# Patient Record
Sex: Female | Born: 1960 | Race: White | Hispanic: No | Smoking: Current every day smoker
Health system: Southern US, Community
[De-identification: ages and names within clinical notes are randomized; demographics above are authoritative.]

## PROBLEM LIST (undated history)

## (undated) DIAGNOSIS — J208 Acute bronchitis due to other specified organisms: Secondary | ICD-10-CM

## (undated) DIAGNOSIS — I10 Essential (primary) hypertension: Secondary | ICD-10-CM

## (undated) DIAGNOSIS — B9689 Other specified bacterial agents as the cause of diseases classified elsewhere: Secondary | ICD-10-CM

## (undated) HISTORY — PX: OTHER SURGICAL HISTORY: SHX169

## (undated) HISTORY — DX: Other specified bacterial agents as the cause of diseases classified elsewhere: J20.8

## (undated) HISTORY — PX: TUBAL LIGATION: SHX77

## (undated) HISTORY — DX: Other specified bacterial agents as the cause of diseases classified elsewhere: B96.89

---

## 2001-09-14 ENCOUNTER — Inpatient Hospital Stay (HOSPITAL_COMMUNITY): Admission: EM | Admit: 2001-09-14 | Discharge: 2001-09-16 | Payer: Self-pay | Admitting: *Deleted

## 2001-09-14 ENCOUNTER — Encounter: Payer: Self-pay | Admitting: Emergency Medicine

## 2001-09-16 ENCOUNTER — Inpatient Hospital Stay (HOSPITAL_COMMUNITY): Admission: AD | Admit: 2001-09-16 | Discharge: 2001-09-21 | Payer: Self-pay | Admitting: Psychiatry

## 2008-06-18 ENCOUNTER — Ambulatory Visit: Payer: Self-pay | Admitting: Family Medicine

## 2008-08-06 ENCOUNTER — Ambulatory Visit: Payer: Self-pay | Admitting: Family Medicine

## 2008-08-19 ENCOUNTER — Ambulatory Visit: Payer: Self-pay | Admitting: Family Medicine

## 2011-04-02 NOTE — H&P (Signed)
[facility]  Patient:    Alisha Franco, Alisha Franco Visit Number: [id number] MRN: [id number]          Service Type: PSY Location: 500 0506 01 Attending Physician:  Denny Peon Dictated by:   Jeanice Lim, M.D. Admit Date:  09/16/2001 Discharge Date: 09/21/2001                     Psychiatric Admission Assessment  IDENTIFYING DATA:  This is a 50 year old separated Caucasian female with a history of depression and a first psychiatric admission that was four years ago.  The patient has been separated since October 11th.  Told by her husband that he is moving out in August after being married for 14 years.  Describing being depressed a year and a half.  Last year, father died.  Upset because of the conflict between her and her husband.  Feels that she lost her identity. Described depressive symptoms including decreased sleep, low energy, crying spells, helplessness and had a positive history of a suicide attempt four years ago, overdosing, resulting in ICU admission.  She admitted to drinking and taking half a bottle of pills as a suicide attempt, expressing some regret at the time of admission.  PAST PSYCHIATRIC HISTORY:  Positive history of suicidality.  Followed by Dr. Donell Beers.  Diagnosed with bipolar five years ago.  Treated with Paxil, Prozac, Zoloft, trazodone in the past, all resulting in side effects.  SOCIAL HISTORY:  Born and raised in [place].  Four siblings.  Completed ninth grade.  Has three children and lives with these children.  Husband moved out October 11th after being married for 14 years.  Last worked four years ago.  FAMILY HISTORY:  Negative for depression or bipolar disorder.  MEDICAL HISTORY:  Primary care physician Dr. Lorina Rabon.  History of hypertension.  MEDICATIONS:  Norvasc and Celexa and Ativan p.r.n.  ALLERGIES:  No known drug allergies.  PHYSICAL EXAMINATION:  Unremarkable.  Neurologically nonfocal.  MENTAL STATUS  EXAMINATION:  Tearful.  Slow.  Agitated.  Depressed.  Affect labile.  Thought process goal directed.  Thought content negative for psychotic symptoms.  Positive passive suicidal ideation.  Cognitively intact.  ADMISSION DIAGNOSES: Axis I:    Major depression, severe, recurrent without psychotic features. Axis II:   None. Axis III:  1. Hypertension.            2. Status post overdose. Axis IV:   Moderate (problems with primary support group). Axis V:    30/65.  TREATMENT PLAN:  The patient was to be admitted, resumed on medications. Medications are optimized for treatment of depression while monitoring safety, developing a cohesive aftercare plan, a couples therapy session was requested and she is to participate in individual, group and milieu therapy.  To be discharged once considered to have no risk issues after benefiting from crisis intervention.  ESTIMATED LENGTH OF STAY:  Five days. Dictated by:   Jeanice Lim, M.D. Attending Physician:  Denny Peon DD:  11/01/01 TD:  11/02/01 Job: [id number]

## 2011-04-02 NOTE — Discharge Summary (Signed)
[facility]  Patient:    Alisha Franco, Alisha Franco Visit Number: [id number] MRN: [id number]          Service Type: PSY Location: 500 0506 01 Attending Physician:  Denny Peon Dictated by:   Jeanice Lim, M.D. Admit Date:  09/16/2001 Discharge Date: 09/21/2001                             Discharge Summary  IDENTIFYING DATA:  This is a 50 year old separated Caucasian female with a history of depression, separated since October after being married for 14 years.  Reporting increased depression, weight loss, and suicidal thoughts with a positive history of suicide attempts.  ADMISSION MEDICATIONS: 1. Norvasc. 2. Celexa. 3. Ativan. 4. History of also Xanax.  ALLERGIES:   No known drug allergies.  PHYSICAL EXAMINATION:  GENERAL:  Unremarkable.  NEUROLOGIC:  Nonfocal.  ROUTINE ADMISSION LABORATORY DATA:  Essentially within normal limits including thyroid profile.  MENTAL STATUS EXAMINATION:  The patient was tearful, psychomotor retarded. Mood: Depressed.  Affect: Labile.  Thought process: Goal directed.  Thought content: Negative for psychotic symptoms.  Passive suicidal ideation was present.  Cognitive: Intact.  ADMITTING DIAGNOSES: Axis I:    Major depression, severe, recurrent without psychotic features. Axis II:   None. Axis III:  History of hypertension. Axis IV:   Mild problems with primary support. Axis V:    30/65.  HOSPITAL COURSE:  The patient was admitted and started on routine p.r.n. medications including Norvasc, Celexa, and Wellbutrin.  The patient was given Librium q.6h. p.r.n. withdrawal and then Klonopin was started low dose to minimize abuse risk and optimize control of anxiety.  Seroquel was scheduled q.h.s.  As medications were optimized, the patient tolerated these without side effects.  CONDITION AT DISCHARGE:  Markedly improved.  She reported feeling less depressed, no longer suicidal.  Mood was mostly euthymic.   Affect: Bright. Thought process: Goal directed.  Thought content: Negative for dangerous ideations or psychotic symptoms.  Cognitive: Intact.  Judgment and insight were good.  Reporting motivation to be compliant with the follow-up plan.  DISPOSITION:  The patient was discharged to follow up with [facility], Dr. Donell Beers and therapist on November 27 at 9:30 a.m.  DISCHARGE MEDICATIONS: 1. Norvasc 10 mg q.a.m. 2. Klonopin 0.5 mg one half q.a.m. and one q.h.s. 3. Celexa 40 mg q.a.m. 4. Wellbutrin SR 150 mg b.i.d. 5. Seroquel 100 mg q.h.s. 6. Ambien 10 mg q.h.s. p.r.n. insomnia.  DISCHARGE DIAGNOSES: Axis I:    Major depression, severe, recurrent without psychotic features. Axis II:   None. Axis III:  History of hypertension. Axis IV:   Mild problems with primary support. Axis V:    Global assessment of functioning on discharge was 55-60. Dictated by:   Jeanice Lim, M.D. Attending Physician:  Denny Peon DD:  11/01/01 TD:  11/02/01 Job: [id number]

## 2014-08-12 ENCOUNTER — Encounter (HOSPITAL_COMMUNITY): Payer: Self-pay | Admitting: Emergency Medicine

## 2014-08-12 ENCOUNTER — Emergency Department (INDEPENDENT_AMBULATORY_CARE_PROVIDER_SITE_OTHER)
Admission: EM | Admit: 2014-08-12 | Discharge: 2014-08-12 | Disposition: A | Discharge status: Home / Self Care | Payer: BC Managed Care – PPO | Source: Home / Self Care | Attending: Emergency Medicine | Admitting: Emergency Medicine

## 2014-08-12 ENCOUNTER — Emergency Department (INDEPENDENT_AMBULATORY_CARE_PROVIDER_SITE_OTHER): Payer: BC Managed Care – PPO

## 2014-08-12 DIAGNOSIS — R030 Elevated blood-pressure reading, without diagnosis of hypertension: Secondary | ICD-10-CM

## 2014-08-12 DIAGNOSIS — S63509A Unspecified sprain of unspecified wrist, initial encounter: Secondary | ICD-10-CM

## 2014-08-12 DIAGNOSIS — IMO0001 Reserved for inherently not codable concepts without codable children: Secondary | ICD-10-CM

## 2014-08-12 DIAGNOSIS — W108XXA Fall (on) (from) other stairs and steps, initial encounter: Secondary | ICD-10-CM

## 2014-08-12 DIAGNOSIS — S63502A Unspecified sprain of left wrist, initial encounter: Secondary | ICD-10-CM

## 2014-08-12 HISTORY — DX: Essential (primary) hypertension: I10

## 2014-08-12 MED ORDER — IBUPROFEN 800 MG PO TABS
800.0000 mg | ORAL_TABLET | Freq: Once | ORAL | Status: AC
  Administered 2014-08-12: 800 mg via ORAL

## 2014-08-12 MED ORDER — IBUPROFEN 800 MG PO TABS
ORAL_TABLET | ORAL | Status: AC
  Filled 2014-08-12: qty 1

## 2014-08-12 NOTE — ED Notes (Signed)
Pt  Fell  yest   On  Wet   Steps      inj  l  Wrist         Pt  Has      Pain     And  Some  Swelling  To  The  l  Wrist

## 2014-08-12 NOTE — ED Provider Notes (Signed)
CSN: [id number]     Arrival date & time 08/12/14  1145 History   First MD Initiated Contact with Patient 08/12/14 1321     Chief Complaint  Patient presents with  . Fall   (Consider location/radiation/quality/duration/timing/severity/associated sxs/prior Treatment) HPI Comments: Patient states she slipped on some outdoor stairs yesterday and fell injuring her left wrist. States she also fell on to left buttock and it is still a bit sore.  Denies previous injury or surgery. No head injury/LOC.  Of note, when patient was notiifed of her elevated blood pressure, patient stated she self discontinued her blood pressure medication because it was causing her feet to swell.    Patient is a 53 y.o. female presenting with fall. The history is provided by the patient.  Fall This is a new problem. The current episode started yesterday.    Past Medical History  Diagnosis Date  . Hypertension    Past Surgical History  Procedure Laterality Date  . Btl     History reviewed. No pertinent family history. History  Substance Use Topics  . Smoking status: Current Every Day Smoker  . Smokeless tobacco: Not on file  . Alcohol Use: Yes   OB History   Grav Para Term Preterm Abortions TAB SAB Ect Mult Living                 Review of Systems  All other systems reviewed and are negative.   Allergies  Review of patient's allergies indicates no known allergies.  Home Medications   Prior to Admission medications   Not on File   BP 187/101  Pulse 115  Temp(Src) 98 F (36.7 C) (Oral)  Resp 16  SpO2 96% Physical Exam  Nursing note and vitals reviewed. Constitutional: She is oriented to person, place, and time. She appears well-developed and well-nourished. No distress.  HENT:  Head: Normocephalic and atraumatic.  Eyes: Conjunctivae are normal. No scleral icterus.  Cardiovascular: Normal rate, regular rhythm and normal heart sounds.   Pulmonary/Chest: Effort normal and breath sounds  normal.  Musculoskeletal:       Left wrist: She exhibits decreased range of motion, tenderness and swelling. She exhibits no effusion, no crepitus, no deformity and no laceration.  Radial pulse intact. CSM exam of left hand intact. +pain with supination and moderate STS at left radial wrist.  Neurological: She is alert and oriented to person, place, and time.  Skin: Skin is warm and dry. No rash noted. No erythema.  +intact  Psychiatric: She has a normal mood and affect. Her behavior is normal.    ED Course  Procedures (including critical care time) Labs Review Labs Reviewed - No data to display  Imaging Review Dg Wrist Complete Left  08/12/2014   CLINICAL DATA:  Patient fell last night. Pain in the radial side of the wrist. Soft tissue swelling and redness. Limited range of motion because of pain.  EXAM: LEFT WRIST - COMPLETE 3+ VIEW  COMPARISON:  None.  FINDINGS: There is no evidence of fracture or dislocation. There is no evidence of arthropathy or other focal bone abnormality. Soft tissues are unremarkable.  IMPRESSION: Negative.   Electronically Signed   By: Rosalie Gums M.D.   On: 08/12/2014 13:38     MDM   1. Wrist sprain, left, initial encounter   2. Elevated blood pressure    Films negative for acute injury. Will place in splint and advise RICE therapy and tylenol as directed on packaging at home for pain.  Follow up if no improvement over the next 1-2 weeks.    Ria Clock, [place] 08/12/14 1436

## 2014-08-12 NOTE — Discharge Instructions (Signed)
Your films were without evidence of fracture or dislocation. It would appear that you have sprained your wrist as a result of your fall. Please wear splint as needed for comfort, ice and elevate to reduce swelling. Tylenol as directed on packaging for pain.  In addition, your blood pressure it quite high and I would strongly encourage you to resume taking your blood pressure medication as it has been prescribed.  Wrist Splint A wrist splint is a brace that holds your wrist in a fixed position. It can be used to stabilize your wrist so that broken bones and sprains can heal faster, with less pain. It can also help to relieve pressure on the nerve that runs down the middle of your arm (median nerve). Splints are available in drugstores without a prescription. They are also available by prescription from orthopedic and medical supply stores. Custom splints made from lightweight materials can be made by physical or occupational therapist. HOME CARE INSTRUCTIONS  Wear your splint as instructed by your caregiver. It may be worn while you sleep.  Your caregiver may instruct you how to perform certain exercises at home. These exercises help maintain muscle strength in your hand and wrist. They also help to maintain motion in your fingers. SEEK MEDICAL CARE IF:  You start to lose feeling in your hand or fingers.  Your skin or fingernails turn blue or gray, or they feel cold. MAKE SURE YOU:   Understand these instructions.  Will watch your condition.  Will get help right away if you are not doing well or get worse. Document Released: 10/14/2006 Document Revised: 01/24/2012 Document Reviewed: 02/12/2014 [facility] Patient Information 2015 [place], [place]. This information is not intended to replace advice given to you by your health care provider. Make sure you discuss any questions you have with your health care provider.  DASH Eating Plan DASH stands for "Dietary Approaches to Stop Hypertension." The  DASH eating plan is a healthy eating plan that has been shown to reduce high blood pressure (hypertension). Additional health benefits may include reducing the risk of type 2 diabetes mellitus, heart disease, and stroke. The DASH eating plan may also help with weight loss. WHAT DO I NEED TO KNOW ABOUT THE DASH EATING PLAN? For the DASH eating plan, you will follow these general guidelines:  Choose foods with a percent daily value for sodium of less than 5% (as listed on the food label).  Use salt-free seasonings or herbs instead of table salt or sea salt.  Check with your health care provider or pharmacist before using salt substitutes.  Eat lower-sodium products, often labeled as "lower sodium" or "no salt added."  Eat fresh foods.  Eat more vegetables, fruits, and low-fat dairy products.  Choose whole grains. Look for the word "whole" as the first word in the ingredient list.  Choose fish and skinless chicken or [place] more often than red meat. Limit fish, poultry, and meat to 6 oz (170 g) each day.  Limit sweets, desserts, sugars, and sugary drinks.  Choose heart-healthy fats.  Limit cheese to 1 oz (28 g) per day.  Eat more home-cooked food and less restaurant, buffet, and fast food.  Limit fried foods.  Cook foods using methods other than frying.  Limit canned vegetables. If you do use them, rinse them well to decrease the sodium.  When eating at a restaurant, ask that your food be prepared with less salt, or no salt if possible. WHAT FOODS CAN I EAT? Seek help from a  dietitian for individual calorie needs. Grains Whole grain or whole wheat bread. Brown rice. Whole grain or whole wheat pasta. Quinoa, bulgur, and whole grain cereals. Low-sodium cereals. Corn or whole wheat flour tortillas. Whole grain cornbread. Whole grain crackers. Low-sodium crackers. Vegetables Fresh or frozen vegetables (raw, steamed, roasted, or grilled). Low-sodium or reduced-sodium tomato and  vegetable juices. Low-sodium or reduced-sodium tomato sauce and paste. Low-sodium or reduced-sodium canned vegetables.  Fruits All fresh, canned (in natural juice), or frozen fruits. Meat and Other Protein Products Ground beef (85% or leaner), grass-fed beef, or beef trimmed of fat. Skinless chicken or [place]. Ground chicken or [place]. Pork trimmed of fat. All fish and seafood. Eggs. Dried beans, peas, or lentils. Unsalted nuts and seeds. Unsalted canned beans. Dairy Low-fat dairy products, such as skim or 1% milk, 2% or reduced-fat cheeses, low-fat ricotta or cottage cheese, or plain low-fat yogurt. Low-sodium or reduced-sodium cheeses. Fats and Oils Tub margarines without trans fats. Light or reduced-fat mayonnaise and salad dressings (reduced sodium). Avocado. Safflower, olive, or canola oils. Natural peanut or almond butter. Other Unsalted popcorn and pretzels. The items listed above may not be a complete list of recommended foods or beverages. Contact your dietitian for more options. WHAT FOODS ARE NOT RECOMMENDED? Grains White bread. White pasta. White rice. Refined cornbread. Bagels and croissants. Crackers that contain trans fat. Vegetables Creamed or fried vegetables. Vegetables in a cheese sauce. Regular canned vegetables. Regular canned tomato sauce and paste. Regular tomato and vegetable juices. Fruits Dried fruits. Canned fruit in light or heavy syrup. Fruit juice. Meat and Other Protein Products Fatty cuts of meat. Ribs, chicken wings, bacon, sausage, bologna, salami, chitterlings, fatback, hot dogs, bratwurst, and packaged luncheon meats. Salted nuts and seeds. Canned beans with salt. Dairy Whole or 2% milk, cream, half-and-half, and cream cheese. Whole-fat or sweetened yogurt. Full-fat cheeses or blue cheese. Nondairy creamers and whipped toppings. Processed cheese, cheese spreads, or cheese curds. Condiments Onion and garlic salt, seasoned salt, table salt, and sea salt.  Canned and packaged gravies. Worcestershire sauce. Tartar sauce. Barbecue sauce. Teriyaki sauce. Soy sauce, including reduced sodium. Steak sauce. Fish sauce. Oyster sauce. Cocktail sauce. Horseradish. Ketchup and mustard. Meat flavorings and tenderizers. Bouillon cubes. Hot sauce. Tabasco sauce. Marinades. Taco seasonings. Relishes. Fats and Oils Butter, stick margarine, lard, shortening, ghee, and bacon fat. Coconut, palm kernel, or palm oils. Regular salad dressings. Other Pickles and olives. Salted popcorn and pretzels. The items listed above may not be a complete list of foods and beverages to avoid. Contact your dietitian for more information. WHERE CAN I FIND MORE INFORMATION? National Heart, Lung, and Blood Institute: [employer] Document Released: 10/21/2011 Document Revised: 03/18/2014 Document Reviewed: 09/05/2013 [facility] Patient Information 2015 [place], [place]. This information is not intended to replace advice given to you by your health care provider. Make sure you discuss any questions you have with your health care provider.  Hypertension Hypertension, commonly called high blood pressure, is when the force of blood pumping through your arteries is too strong. Your arteries are the blood vessels that carry blood from your heart throughout your body. A blood pressure reading consists of a higher number over a lower number, such as 110/72. The higher number (systolic) is the pressure inside your arteries when your heart pumps. The lower number (diastolic) is the pressure inside your arteries when your heart relaxes. Ideally you want your blood pressure below 120/80. Hypertension forces your heart to work harder to pump blood. Your arteries may become narrow or  stiff. Having hypertension puts you at risk for heart disease, stroke, and other problems.  RISK FACTORS Some risk factors for high blood pressure are controllable. Others are not.  Risk  factors you cannot control include:   Race. You may be at higher risk if you are African American.  Age. Risk increases with age.  Gender. Men are at higher risk than women before age 15 years. After age 25, women are at higher risk than men. Risk factors you can control include:  Not getting enough exercise or physical activity.  Being overweight.  Getting too much fat, sugar, calories, or salt in your diet.  Drinking too much alcohol. SIGNS AND SYMPTOMS Hypertension does not usually cause signs or symptoms. Extremely high blood pressure (hypertensive crisis) may cause headache, anxiety, shortness of breath, and nosebleed. DIAGNOSIS  To check if you have hypertension, your health care provider will measure your blood pressure while you are seated, with your arm held at the level of your heart. It should be measured at least twice using the same arm. Certain conditions can cause a difference in blood pressure between your right and left arms. A blood pressure reading that is higher than normal on one occasion does not mean that you need treatment. If one blood pressure reading is high, ask your health care provider about having it checked again. TREATMENT  Treating high blood pressure includes making lifestyle changes and possibly taking medicine. Living a healthy lifestyle can help lower high blood pressure. You may need to change some of your habits. Lifestyle changes may include:  Following the DASH diet. This diet is high in fruits, vegetables, and whole grains. It is low in salt, red meat, and added sugars.  Getting at least 2 hours of brisk physical activity every week.  Losing weight if necessary.  Not smoking.  Limiting alcoholic beverages.  Learning ways to reduce stress. If lifestyle changes are not enough to get your blood pressure under control, your health care provider may prescribe medicine. You may need to take more than one. Work closely with your health care  provider to understand the risks and benefits. HOME CARE INSTRUCTIONS  Have your blood pressure rechecked as directed by your health care provider.   Take medicines only as directed by your health care provider. Follow the directions carefully. Blood pressure medicines must be taken as prescribed. The medicine does not work as well when you skip doses. Skipping doses also puts you at risk for problems.   Do not smoke.   Monitor your blood pressure at home as directed by your health care provider. SEEK MEDICAL CARE IF:   You think you are having a reaction to medicines taken.  You have recurrent headaches or feel dizzy.  You have swelling in your ankles.  You have trouble with your vision. SEEK IMMEDIATE MEDICAL CARE IF:  You develop a severe headache or confusion.  You have unusual weakness, numbness, or feel faint.  You have severe chest or abdominal pain.  You vomit repeatedly.  You have trouble breathing. MAKE SURE YOU:   Understand these instructions.  Will watch your condition.  Will get help right away if you are not doing well or get worse. Document Released: 11/01/2005 Document Revised: 03/18/2014 Document Reviewed: 08/24/2013 [facility] Patient Information 2015 [place], [place]. This information is not intended to replace advice given to you by your health care provider. Make sure you discuss any questions you have with your health care provider.

## 2014-08-14 NOTE — ED Provider Notes (Signed)
Medical screening examination/treatment/procedure(s) were performed by non-physician practitioner and as supervising physician I was immediately available for consultation/collaboration.  Leslee Homeavid Traeger Sultana, M.D.  Reuben Likesavid C Mailen Newborn, MD 08/14/14 [phone]

## 2015-06-23 ENCOUNTER — Encounter: Payer: Self-pay | Admitting: Family Medicine

## 2015-06-23 ENCOUNTER — Ambulatory Visit (INDEPENDENT_AMBULATORY_CARE_PROVIDER_SITE_OTHER): Payer: BLUE CROSS/BLUE SHIELD | Admitting: Family Medicine

## 2015-06-23 VITALS — BP 202/110 | HR 130 | Ht 67.0 in | Wt 115.0 lb

## 2015-06-23 DIAGNOSIS — R000 Tachycardia, unspecified: Secondary | ICD-10-CM | POA: Diagnosis not present

## 2015-06-23 DIAGNOSIS — Z8659 Personal history of other mental and behavioral disorders: Secondary | ICD-10-CM | POA: Diagnosis not present

## 2015-06-23 DIAGNOSIS — R1013 Epigastric pain: Secondary | ICD-10-CM | POA: Diagnosis not present

## 2015-06-23 DIAGNOSIS — I10 Essential (primary) hypertension: Secondary | ICD-10-CM | POA: Diagnosis not present

## 2015-06-23 DIAGNOSIS — F1010 Alcohol abuse, uncomplicated: Secondary | ICD-10-CM | POA: Diagnosis not present

## 2015-06-23 LAB — CBC WITH DIFFERENTIAL/PLATELET
BASOS ABS: 0 10*3/uL (ref 0.0–0.1)
Basophils Relative: 0 % (ref 0–1)
Eosinophils Absolute: 0 10*3/uL (ref 0.0–0.7)
Eosinophils Relative: 0 % (ref 0–5)
HCT: 39 % (ref 36.0–46.0)
HEMOGLOBIN: 13.6 g/dL (ref 12.0–15.0)
LYMPHS ABS: 1.7 10*3/uL (ref 0.7–4.0)
Lymphocytes Relative: 16 % (ref 12–46)
MCH: 31.9 pg (ref 26.0–34.0)
MCHC: 34.9 g/dL (ref 30.0–36.0)
MCV: 91.5 fL (ref 78.0–100.0)
MPV: 8.2 fL — AB (ref 8.6–12.4)
Monocytes Absolute: 0.7 10*3/uL (ref 0.1–1.0)
Monocytes Relative: 6 % (ref 3–12)
NEUTROS PCT: 78 % — AB (ref 43–77)
Neutro Abs: 8.5 10*3/uL — ABNORMAL HIGH (ref 1.7–7.7)
PLATELETS: 325 10*3/uL (ref 150–400)
RBC: 4.26 MIL/uL (ref 3.87–5.11)
RDW: 12.5 % (ref 11.5–15.5)
WBC: 10.9 10*3/uL — AB (ref 4.0–10.5)

## 2015-06-23 LAB — COMPREHENSIVE METABOLIC PANEL
ALK PHOS: 88 U/L (ref 33–130)
ALT: 18 U/L (ref 6–29)
AST: 23 U/L (ref 10–35)
Albumin: 4.4 g/dL (ref 3.6–5.1)
BILIRUBIN TOTAL: 0.5 mg/dL (ref 0.2–1.2)
BUN: 7 mg/dL (ref 7–25)
CHLORIDE: 92 mmol/L — AB (ref 98–110)
CO2: 21 mmol/L (ref 20–31)
Calcium: 9.6 mg/dL (ref 8.6–10.4)
Creat: 0.71 mg/dL (ref 0.50–1.05)
Glucose, Bld: 85 mg/dL (ref 65–99)
POTASSIUM: 4.2 mmol/L (ref 3.5–5.3)
Sodium: 128 mmol/L — ABNORMAL LOW (ref 135–146)
TOTAL PROTEIN: 7.7 g/dL (ref 6.1–8.1)

## 2015-06-23 LAB — LIPID PANEL
CHOL/HDL RATIO: 2.8 ratio (ref <=5.0)
CHOLESTEROL: 262 mg/dL — AB (ref 125–200)
HDL: 93 mg/dL (ref >=46)
LDL Cholesterol: 149 mg/dL — ABNORMAL HIGH (ref <130)
Triglycerides: 100 mg/dL (ref <150)
VLDL: 20 mg/dL (ref <30)

## 2015-06-23 MED ORDER — LOSARTAN POTASSIUM-HCTZ 100-12.5 MG PO TABS: 1.0000 | ORAL_TABLET | Freq: Every day | ORAL | Status: DC

## 2015-06-23 NOTE — Progress Notes (Signed)
   Subjective:    Patient ID: Alisha Franco, female    DOB: 12/19/1960, 54 y.o.   MRN: [id number]  HPI She is here for a consultation. She has not been seen here in several years. She has a history of hypertension however stopped taking her medication due to cramping. She also has a history of alcohol abuse and states she has a weekend alcoholic. She also has a previous history of anxiety attacks and has been on antianxiety medications as well as an antidepressive. She again stopped this on her own. [facility] Recently she has had difficulty with abdominal pain. She does have a previous history of abdominal pain and apparently was seen in the past by GI. Presently she complains of early satiety no nausea, vomiting or diarrhea. She also is had some slight nausea and bloating. Today she noted worsening of her anxiety. She states that she is not suicidal. She is here with her son. She states that her last drink was last night around 3 in the morning.   Review of Systems     Objective:   Physical Exam Alert and in no distress. Tympanic membranes and canals are normal. Pharyngeal area is normal. Neck is supple without adenopathy or thyromegaly. Cardiac exam shows a regular sinus rhythm without murmurs or gallops. Lungs are clear to auscultation. Skin is warm and dry. DTRs are normal.         Assessment & Plan:  Essential hypertension - Plan: losartan-hydrochlorothiazide (HYZAAR) 100-12.5 MG per tablet, CBC with Differential/Platelet, Comprehensive metabolic panel, Lipid panel  Epigastric pain - Plan: CBC with Differential/Platelet, Comprehensive metabolic panel, Lipid panel  Alcohol abuse - Plan: CBC with Differential/Platelet, Comprehensive metabolic panel, Lipid panel  History of depression  Tachycardia  heart rate actually slowed down to close to 100 before she left. Discussed the fact that I cannot put her on anxiety  medicines and antidepressives at the present time since she is still actively drinking. Recommend she call the [facility] or [facility]. I will place her back on a different anti-hypertensive medication. Routine blood work will be ordered. Recommend she use Prilosec for her abdominal pain. We will follow-up on all these in one or 2 months.

## 2015-06-23 NOTE — Patient Instructions (Addendum)
Call the [facility]  Or [facility]  [phone]  stop the drinking. 2 daily Prilosec

## 2015-06-30 ENCOUNTER — Encounter: Payer: Self-pay | Admitting: Family Medicine

## 2015-06-30 ENCOUNTER — Ambulatory Visit (INDEPENDENT_AMBULATORY_CARE_PROVIDER_SITE_OTHER): Payer: BLUE CROSS/BLUE SHIELD | Admitting: Family Medicine

## 2015-06-30 VITALS — BP 140/90 | HR 101

## 2015-06-30 DIAGNOSIS — E871 Hypo-osmolality and hyponatremia: Secondary | ICD-10-CM | POA: Diagnosis not present

## 2015-06-30 DIAGNOSIS — R1013 Epigastric pain: Secondary | ICD-10-CM | POA: Diagnosis not present

## 2015-06-30 DIAGNOSIS — R634 Abnormal weight loss: Secondary | ICD-10-CM

## 2015-06-30 DIAGNOSIS — F1010 Alcohol abuse, uncomplicated: Secondary | ICD-10-CM

## 2015-06-30 LAB — COMPREHENSIVE METABOLIC PANEL
ALT: 19 U/L (ref 6–29)
AST: 30 U/L (ref 10–35)
Albumin: 4.4 g/dL (ref 3.6–5.1)
Alkaline Phosphatase: 94 U/L (ref 33–130)
BUN: 12 mg/dL (ref 7–25)
CALCIUM: 9.7 mg/dL (ref 8.6–10.4)
CHLORIDE: 71 mmol/L — AB (ref 98–110)
CO2: 22 mmol/L (ref 20–31)
Creat: 0.99 mg/dL (ref 0.50–1.05)
GLUCOSE: 88 mg/dL (ref 65–99)
POTASSIUM: 4.1 mmol/L (ref 3.5–5.3)
Sodium: 110 mmol/L — CL (ref 135–146)
Total Bilirubin: 1.5 mg/dL — ABNORMAL HIGH (ref 0.2–1.2)
Total Protein: 7.9 g/dL (ref 6.1–8.1)

## 2015-06-30 NOTE — Patient Instructions (Signed)
Stop the blood pressure medicine and see if the symptoms go away and keep me informed

## 2015-06-30 NOTE — Progress Notes (Signed)
   Subjective:    Patient ID: Alisha Franco, female    DOB: [birth year], 54 y.o.   MRN: [id number]  HPI He is here for a recheck. She continues to have difficulty with nausea and vomiting and has lost 3 pounds since her last visit. She does complain of continued difficulty with early satiety and cramping abdominal pain. The Prilosec has not helped her. She says she has cut down on her alcohol use to 1-3 beers per day. She states that all her muscles and joints aches and she blames this on the medication.He states he has a previous difficulty with stomach trouble and was given antibody syndrome indicating probable H. Pylori infection.  Review of Systems     Objective:   Physical Exam Alert and in no distress otherwise not examined       Assessment & Plan:  Epigastric pain - Plan: Comprehensive metabolic panel  Alcohol abuse - Plan: Amylase, Lipase, Ethanol  Loss of weight - Plan: Comprehensive metabolic panel  Hyponatremia - Plan: Comprehensive metabolic panel  Very difficult to get a good handle on this.A recheck her metabolic status and if continued difficulty may need refer to GI. Will also check for possible pancreatitis due to her alcohol use. She states her last alcohol was last night however I will still check an alcohol level.

## 2015-07-01 ENCOUNTER — Encounter: Payer: Self-pay | Admitting: Emergency Medicine

## 2015-07-01 ENCOUNTER — Emergency Department: Payer: BLUE CROSS/BLUE SHIELD

## 2015-07-01 ENCOUNTER — Inpatient Hospital Stay
Admission: EM | Admit: 2015-07-01 | Discharge: 2015-07-03 | DRG: 641 | Disposition: A | Discharge status: Home / Self Care | Payer: BLUE CROSS/BLUE SHIELD | Attending: Internal Medicine | Admitting: Internal Medicine

## 2015-07-01 ENCOUNTER — Inpatient Hospital Stay: Payer: BLUE CROSS/BLUE SHIELD

## 2015-07-01 DIAGNOSIS — E871 Hypo-osmolality and hyponatremia: Secondary | ICD-10-CM | POA: Diagnosis not present

## 2015-07-01 DIAGNOSIS — E785 Hyperlipidemia, unspecified: Secondary | ICD-10-CM | POA: Diagnosis present

## 2015-07-01 DIAGNOSIS — F17210 Nicotine dependence, cigarettes, uncomplicated: Secondary | ICD-10-CM | POA: Diagnosis present

## 2015-07-01 DIAGNOSIS — Z9851 Tubal ligation status: Secondary | ICD-10-CM

## 2015-07-01 DIAGNOSIS — R195 Other fecal abnormalities: Secondary | ICD-10-CM

## 2015-07-01 DIAGNOSIS — Z95828 Presence of other vascular implants and grafts: Secondary | ICD-10-CM

## 2015-07-01 DIAGNOSIS — Z716 Tobacco abuse counseling: Secondary | ICD-10-CM | POA: Diagnosis present

## 2015-07-01 DIAGNOSIS — I10 Essential (primary) hypertension: Secondary | ICD-10-CM | POA: Diagnosis present

## 2015-07-01 DIAGNOSIS — R197 Diarrhea, unspecified: Secondary | ICD-10-CM | POA: Diagnosis present

## 2015-07-01 DIAGNOSIS — E876 Hypokalemia: Secondary | ICD-10-CM | POA: Diagnosis present

## 2015-07-01 DIAGNOSIS — F1010 Alcohol abuse, uncomplicated: Secondary | ICD-10-CM | POA: Diagnosis present

## 2015-07-01 LAB — COMPREHENSIVE METABOLIC PANEL
ALBUMIN: 4.9 g/dL (ref 3.5–5.0)
ALK PHOS: 100 U/L (ref 38–126)
ALT: 29 U/L (ref 14–54)
ANION GAP: 17 — AB (ref 5–15)
AST: 46 U/L — AB (ref 15–41)
BILIRUBIN TOTAL: 1.5 mg/dL — AB (ref 0.3–1.2)
BUN: 15 mg/dL (ref 6–20)
CALCIUM: 9.6 mg/dL (ref 8.9–10.3)
CO2: 28 mmol/L (ref 22–32)
Chloride: 70 mmol/L — ABNORMAL LOW (ref 101–111)
Creatinine, Ser: 1.43 mg/dL — ABNORMAL HIGH (ref 0.44–1.00)
GFR calc Af Amer: 47 mL/min — ABNORMAL LOW (ref >60)
GFR calc non Af Amer: 41 mL/min — ABNORMAL LOW (ref >60)
GLUCOSE: 87 mg/dL (ref 65–99)
Potassium: 3.1 mmol/L — ABNORMAL LOW (ref 3.5–5.1)
SODIUM: 115 mmol/L — AB (ref 135–145)
Total Protein: 8.6 g/dL — ABNORMAL HIGH (ref 6.5–8.1)

## 2015-07-01 LAB — BASIC METABOLIC PANEL
Anion gap: 14 (ref 5–15)
BUN: 16 mg/dL (ref 6–20)
CHLORIDE: 72 mmol/L — AB (ref 101–111)
CO2: 28 mmol/L (ref 22–32)
Calcium: 9.2 mg/dL (ref 8.9–10.3)
Creatinine, Ser: 1.42 mg/dL — ABNORMAL HIGH (ref 0.44–1.00)
GFR calc non Af Amer: 41 mL/min — ABNORMAL LOW (ref >60)
GFR, EST AFRICAN AMERICAN: 48 mL/min — AB (ref >60)
Glucose, Bld: 168 mg/dL — ABNORMAL HIGH (ref 65–99)
POTASSIUM: 2.8 mmol/L — AB (ref 3.5–5.1)
SODIUM: 114 mmol/L — AB (ref 135–145)

## 2015-07-01 LAB — TSH: TSH: 1.422 u[IU]/mL (ref 0.350–4.500)

## 2015-07-01 LAB — CBC
HEMATOCRIT: 37 % (ref 35.0–47.0)
HEMOGLOBIN: 13.3 g/dL (ref 12.0–16.0)
MCH: 32 pg (ref 26.0–34.0)
MCHC: 36 g/dL (ref 32.0–36.0)
MCV: 88.9 fL (ref 80.0–100.0)
Platelets: 295 10*3/uL (ref 150–440)
RBC: 4.16 MIL/uL (ref 3.80–5.20)
RDW: 11.8 % (ref 11.5–14.5)
WBC: 10.2 10*3/uL (ref 3.6–11.0)

## 2015-07-01 LAB — URINALYSIS COMPLETE WITH MICROSCOPIC (ARMC ONLY)
BACTERIA UA: NONE SEEN
BILIRUBIN URINE: NEGATIVE
Glucose, UA: NEGATIVE mg/dL
LEUKOCYTES UA: NEGATIVE
Nitrite: NEGATIVE
PH: 6 (ref 5.0–8.0)
PROTEIN: NEGATIVE mg/dL
RBC / HPF: NONE SEEN RBC/hpf (ref 0–5)
Specific Gravity, Urine: 1.005 (ref 1.005–1.030)

## 2015-07-01 LAB — PHOSPHORUS: Phosphorus: 2.3 mg/dL — ABNORMAL LOW (ref 2.5–4.6)

## 2015-07-01 LAB — MAGNESIUM: Magnesium: 1.7 mg/dL (ref 1.7–2.4)

## 2015-07-01 LAB — ETHANOL

## 2015-07-01 LAB — SODIUM, URINE, RANDOM

## 2015-07-01 LAB — LIPASE: Lipase: 21 U/L (ref 7–60)

## 2015-07-01 LAB — AMYLASE: Amylase: 23 U/L (ref 0–105)

## 2015-07-01 LAB — CREATININE, URINE, RANDOM: Creatinine, Urine: 63 mg/dL

## 2015-07-01 MED ORDER — ACETAMINOPHEN 650 MG RE SUPP
650.0000 mg | Freq: Four times a day (QID) | RECTAL | Status: DC | PRN
Start: 2015-07-01 — End: 2015-07-03

## 2015-07-01 MED ORDER — METRONIDAZOLE 500 MG PO TABS
500.0000 mg | ORAL_TABLET | Freq: Three times a day (TID) | ORAL | Status: DC
  Administered 2015-07-01 – 2015-07-03 (×6): 500 mg via ORAL
  Filled 2015-07-01 (×6): qty 1

## 2015-07-01 MED ORDER — FOLIC ACID 1 MG PO TABS
1.0000 mg | ORAL_TABLET | Freq: Every day | ORAL | Status: DC
  Administered 2015-07-01 – 2015-07-03 (×3): 1 mg via ORAL
  Filled 2015-07-01 (×3): qty 1

## 2015-07-01 MED ORDER — SODIUM CHLORIDE 3 % IV SOLN
INTRAVENOUS | Status: DC
  Administered 2015-07-01: 30 mL/h via INTRAVENOUS
  Filled 2015-07-01 (×4): qty 500

## 2015-07-01 MED ORDER — CIPROFLOXACIN HCL 500 MG PO TABS
500.0000 mg | ORAL_TABLET | Freq: Two times a day (BID) | ORAL | Status: DC
  Administered 2015-07-01 – 2015-07-03 (×5): 500 mg via ORAL
  Filled 2015-07-01 (×4): qty 1

## 2015-07-01 MED ORDER — ALBUTEROL SULFATE (2.5 MG/3ML) 0.083% IN NEBU: 2.5000 mg | INHALATION_SOLUTION | RESPIRATORY_TRACT | Status: DC | PRN

## 2015-07-01 MED ORDER — ONDANSETRON HCL 4 MG/2ML IJ SOLN: 4.0000 mg | Freq: Four times a day (QID) | INTRAMUSCULAR | Status: DC | PRN

## 2015-07-01 MED ORDER — ACETAMINOPHEN 325 MG PO TABS: 650.0000 mg | ORAL_TABLET | Freq: Four times a day (QID) | ORAL | Status: DC | PRN

## 2015-07-01 MED ORDER — ADULT MULTIVITAMIN W/MINERALS CH
1.0000 | ORAL_TABLET | Freq: Every day | ORAL | Status: DC
  Administered 2015-07-01 – 2015-07-03 (×3): 1 via ORAL
  Filled 2015-07-01 (×3): qty 1

## 2015-07-01 MED ORDER — POTASSIUM CHLORIDE CRYS ER 20 MEQ PO TBCR
40.0000 meq | EXTENDED_RELEASE_TABLET | Freq: Two times a day (BID) | ORAL | Status: DC
  Administered 2015-07-01 – 2015-07-03 (×4): 40 meq via ORAL
  Filled 2015-07-01 (×4): qty 2

## 2015-07-01 MED ORDER — VITAMIN B-1 100 MG PO TABS
100.0000 mg | ORAL_TABLET | Freq: Every day | ORAL | Status: DC
  Administered 2015-07-01 – 2015-07-03 (×3): 100 mg via ORAL
  Filled 2015-07-01 (×3): qty 1

## 2015-07-01 MED ORDER — POTASSIUM CHLORIDE IN NACL 40-0.9 MEQ/L-% IV SOLN
INTRAVENOUS | Status: DC
  Administered 2015-07-01 – 2015-07-03 (×6): 125 mL/h via INTRAVENOUS
  Filled 2015-07-01 (×9): qty 1000

## 2015-07-01 MED ORDER — ZOLPIDEM TARTRATE 5 MG PO TABS
5.0000 mg | ORAL_TABLET | Freq: Every evening | ORAL | Status: DC | PRN
  Administered 2015-07-01: 21:00:00 5 mg via ORAL
  Filled 2015-07-01: qty 1

## 2015-07-01 MED ORDER — ONDANSETRON HCL 4 MG PO TABS: 4.0000 mg | ORAL_TABLET | Freq: Four times a day (QID) | ORAL | Status: DC | PRN

## 2015-07-01 MED ORDER — HEPARIN SODIUM (PORCINE) 5000 UNIT/ML IJ SOLN
5000.0000 [IU] | Freq: Three times a day (TID) | INTRAMUSCULAR | Status: DC
  Administered 2015-07-01 – 2015-07-02 (×5): 5000 [IU] via SUBCUTANEOUS
  Filled 2015-07-01 (×6): qty 1

## 2015-07-01 MED ORDER — PANTOPRAZOLE SODIUM 40 MG PO TBEC
40.0000 mg | DELAYED_RELEASE_TABLET | Freq: Every day | ORAL | Status: DC
Start: 2015-07-01 — End: 2015-07-03
  Administered 2015-07-01 – 2015-07-03 (×3): 40 mg via ORAL
  Filled 2015-07-01 (×3): qty 1

## 2015-07-01 NOTE — Plan of Care (Signed)
Problem: Discharge Progression Outcomes Goal: Other Discharge Outcomes/Goals Outcome: Progressing Plan of Care Progress to Goal:  Pt admitted w low Na of 115 and K+ of 2.8. 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  Told her that alcohol is dehydrating too.  Pt doesn't c/o pain. On CIWA but scores 0.  Pt is having PICC placed b/c she is to get 3% NS which is highly irritating.  Pt is also a smoker.  She's been put on enteric precautions she had diarrhea earlier this week - had 1 episode of diarrhea earlier today - no more.  Also sd she's had nausea but not on the floor.  Has very good appetite.

## 2015-07-01 NOTE — ED Notes (Signed)
Sent from pcp for abnormal labs.  Says she went to dr for upset stomach, n/v/d.  Started bp med last Tuesday.  Feels that has worsened abd cramping.  Also joints/muscle aches.

## 2015-07-01 NOTE — ED Notes (Addendum)
Pt states nausea, vomiting, and diaherra for several weeks, pt PCP told her NA of 110 and sent to ER, pt awake and alert in no distress, son at bedside, son states pt has been "acting different", pt states last episode of vomiting yesterday and diaherra this AM

## 2015-07-01 NOTE — ED Notes (Signed)
Dr. Fanny Bien notified of critical NA

## 2015-07-01 NOTE — H&P (Addendum)
[facility] [place] [facility]   PATIENT NAME: Alisha Franco    MR#:  [id number]  DATE OF BIRTH:  03-05-1961  DATE OF ADMISSION:  07/01/2015  PRIMARY CARE PHYSICIAN: Carollee Herter, MD   REQUESTING/REFERRING PHYSICIAN: Sharyn Creamer, MD  CHIEF COMPLAINT:   Chief Complaint  Patient presents with  . Abnormal Lab   Low sodium level 115 today. HISTORY OF PRESENT ILLNESS:  Alisha Franco  is a 54 y.o. female with a known history of hypertension and alcohol abuse. The patient was sent to ED from urgent care due to low sodium at 115 today. The patient has had the intermittent diarrhea for the past one to 2 weeks which is loose and watery without melena or bloody stool. Patient also complains of nausea, vomiting and abdominal cramp and a lot of gas. But the patient denies any fever, chills, dysuria or urine frequency. Patient denies any other symptoms. She was started with losartan-HCTZ one week ago, which was discontinued yesterday due to low sodium.. Sodium in ED is 110 today.  PAST MEDICAL HISTORY:   Past Medical History  Diagnosis Date  . Hypertension     PAST SURGICAL HISTORY:   Past Surgical History  Procedure Laterality Date  . Btl    . Depression      SOCIAL HISTORY:   Social History  Substance Use Topics  . Smoking status: Current Every Day Smoker  . Smokeless tobacco: Not on file  . Alcohol Use: Yes   smoker 1 pack a day since teenager. Drinks 6-8 cans of beers every evening. She denies any illicit drugs.  FAMILY HISTORY:  No family history on file. both parents had stroke.  DRUG ALLERGIES:  No Known Allergies  REVIEW OF SYSTEMS:  CONSTITUTIONAL: No fever, generalized weakness. Has had poor appetite and poor oral intake. EYES: No blurred or double vision.  EARS, NOSE, AND THROAT: No tinnitus or ear pain.  RESPIRATORY: No cough, shortness of breath, wheezing or hemoptysis.  CARDIOVASCULAR: No chest pain, orthopnea, edema.   GASTROINTESTINAL: Has nausea, vomiting, diarrhea and abdominal pain.  GENITOURINARY: No dysuria, hematuria.  ENDOCRINE: No polyuria, nocturia,  HEMATOLOGY: No anemia, easy bruising or bleeding SKIN: No rash or lesion. MUSCULOSKELETAL: No joint pain or arthritis.   NEUROLOGIC: No tingling, numbness, weakness. No tremor or shaking. PSYCHIATRY: No anxiety or depression.   MEDICATIONS AT HOME:   Prior to Admission medications   Medication Sig Start Date End Date Taking? Authorizing Provider  esomeprazole (NEXIUM) 40 MG capsule Take 40 mg by mouth daily at 12 noon.   Yes Historical Provider, MD      VITAL SIGNS:  Blood pressure 159/69, pulse 95, temperature 97.8 F (36.6 C), temperature source Oral, resp. rate 20, height  (1.702 m), weight 50.349 kg (111 lb), SpO2 100 %.  PHYSICAL EXAMINATION:  GENERAL:  54 y.o.-year-old patient lying in the bed with no acute distress.  EYES: Pupils equal, round, reactive to light and accommodation. No scleral icterus. Extraocular muscles intact.  HEENT: Head atraumatic, normocephalic. Oropharynx and nasopharynx clear.  NECK:  Supple, no jugular venous distention. No thyroid enlargement, no tenderness.  LUNGS: Normal breath sounds bilaterally, no wheezing, rales,rhonchi or crepitation. No use of accessory muscles of respiration.  CARDIOVASCULAR: S1, S2 normal. No murmurs, rubs, or gallops.  ABDOMEN: Soft, nontender, nondistended. Bowel sounds present. No organomegaly or mass.  EXTREMITIES: No pedal edema, cyanosis, or clubbing.  NEUROLOGIC: Cranial nerves II through XII are intact. Muscle strength 5/5 in  all extremities. Sensation intact. Gait not checked.  PSYCHIATRIC: The patient is alert and oriented x 3.  SKIN: No obvious rash, lesion, or ulcer.   LABORATORY PANEL:   CBC No results for input(s): WBC, HGB, HCT, PLT in the last 168  hours. ------------------------------------------------------------------------------------------------------------------  Chemistries   Recent Labs Lab 06/30/15 0001  NA 110*  K 4.1  CL 71*  CO2 22  GLUCOSE 88  BUN 12  CREATININE 0.99  CALCIUM 9.7  AST 30  ALT 19  ALKPHOS 94  BILITOT 1.5*   ------------------------------------------------------------------------------------------------------------------  Cardiac Enzymes No results for input(s): TROPONINI in the last 168 hours. ------------------------------------------------------------------------------------------------------------------  RADIOLOGY:  Dg Chest 2 View  07/01/2015   CLINICAL DATA:  Dry cough for months. Hyponatremia. Evaluate for nodule or tumor. Smoker.  EXAM: CHEST  2 VIEW  COMPARISON:  None.  FINDINGS: Hyperinflation of the lungs. Heart and mediastinal contours are within normal limits. No focal opacities or effusions. No acute bony abnormality.  IMPRESSION: Hyperinflation.  No active cardiopulmonary disease.   Electronically Signed   By: Charlett Nose M.D.   On: 07/01/2015 11:27    EKG:   Orders placed or performed during the hospital encounter of 07/01/15  . ED EKG  . ED EKG    IMPRESSION AND PLAN:   Severe hyponatremia, possible due to diarrhea and alcohol abuse. Diarrhea Alcohol abuse Tobacco abuse Hypertension Hyperlipidemia (LDL 149)  The patient will be admitted to medical floor. I will start 3% saline IV and follow-up BMP every 6 hours, request a nephrology consult. I will hold losartan-HCTZ.  I will check stool C. difficile and culture, start Cipro and Flagyl.  For alcohol abuse, I will start CIWA protocol.  Tobacco abuse, smoking cessation was counseled for 4-5 minutes, the patient refused nicotine patch. For hypertension, I will hold losartan-HCTZ, monitoring her blood pressure, may need add hypertension medication depending on blood pressure.  All the records are reviewed and  case discussed with ED provider. Management plans discussed with the patient, her son and they are in agreement.  CODE STATUS: Full code  TOTAL TIME TAKING CARE OF THIS PATIENT: 57 minutes.    Shaune Pollack M.D on 07/01/2015 at 1:02 PM  Between 7am to 6pm - Pager - [phone]  After 6pm go to [email or web address] - password [email or web address] [facility]  [facility]  Office  [phone]  CC: Primary care physician; Carollee Herter, MD

## 2015-07-01 NOTE — Progress Notes (Signed)
   Subjective:    Patient ID: Alisha Franco, female    DOB: 1961/02/28, 54 y.o.   MRN: [id number]  HPI    Review of Systems     Objective:   Physical Exam        Assessment & Plan:  Blood work was ordered yesterday and apparently the lab called 3 times to the answering service. I did not receive any calls from the answering service. This is still being investigated. Cheri called the patient and sent her immediately to the emergency room. At this time the amylase and lipase are still pending.

## 2015-07-01 NOTE — ED Notes (Signed)
Pt given food and drink, tolerating well.

## 2015-07-01 NOTE — Plan of Care (Signed)
Problem: Altered Nutrition-Related Laboratory Values ([place]-2.2) Goal: Patient/family cooperates with treatment and discharge plans Outcome: Progressing Individualization: Pt is a 54 year old female who lives at  Home with her son and works Environmental education officer at a [employer]. Pt states she has felt nauseated and vomited 1-2 times a week for a month. Pt states she has felt very weak and has stomach cramps at times. Pt takes Lisinopril with HCTZ and states I have been Sweating a lot. Pt admitted for Hyponatremia with a NA level of 115  Goals towards discharge: 1. absence of nausea, vomiting, and abdominal cramps 2. usual mental status 3. usual muscle strength 4. absence of seizure activity 5. serum sodium within normal range. 6. Monitor BP, pt has a history of hypertension, but has been hypotensive this admission 7. Decrease falls by assisting pt to bathroom and with ADL care 8.  9.

## 2015-07-01 NOTE — ED Provider Notes (Signed)
[facility] Emergency Department Provider Note  ____________________________________________  Time seen: Approximately 10:43 AM  I have reviewed the triage vital signs and the nursing notes.   HISTORY  Chief Complaint Abnormal Lab    HPI Jolonda Gomm is a 54 y.o. female the history of depression, hypertension and alcohol abuse who presents today with loose stools for approximately 2 months and occasional aching in her abdomen.No blood in her diarrhea. She reports she works in a hot environment has to drink 5, 16 ounce beverages during the day. She does also drink up to 6 alcoholic drinks per evening. She denies a history of withdrawals. No fevers or chills. No chest pain. Currently not having any discomfort or pain.  She saw her primary care doctor and was told her sodium was very low at 110 and was referred to the emergency room for further evaluation. No seizures.   Past Medical History  Diagnosis Date  . Hypertension     Patient Active Problem List   Diagnosis Date Noted  . Essential hypertension 06/23/2015  . Alcohol abuse 06/23/2015  . History of depression 06/23/2015    Past Surgical History  Procedure Laterality Date  . Btl    . Depression      Current Outpatient Rx  Name  Route  Sig  Dispense  Refill  . esomeprazole (NEXIUM) 40 MG capsule   Oral   Take 40 mg by mouth daily at 12 noon.           Allergies Review of patient's allergies indicates no known allergies.  No family history on file.  Social History Social History  Substance Use Topics  . Smoking status: Current Every Day Smoker  . Smokeless tobacco: None  . Alcohol Use: Yes    Review of Systems Constitutional: No fever/chills Eyes: No visual changes. ENT: No sore throat. Cardiovascular: Denies chest pain. Respiratory: Denies shortness of breath. Occasional dry cough, slightly worse in the last 2 months. Gastrointestinal: See history of present illness No  diarrhea.  No constipation. Genitourinary: Negative for dysuria. Musculoskeletal: Negative for back pain. Skin: Negative for rash. Neurological: Negative for headaches, focal weakness or numbness.  No recent travel, no recent antibiotic's. Denies pregnancy.  10-point ROS otherwise negative.  ____________________________________________   PHYSICAL EXAM:  VITAL SIGNS: ED Triage Vitals  Enc Vitals Group     BP 07/01/15 0927 119/81 mmHg     Pulse Rate 07/01/15 0927 95     Resp 07/01/15 0927 18     Temp 07/01/15 0927 97.8 F (36.6 C)     Temp Source 07/01/15 0927 Oral     SpO2 07/01/15 0927 100 %     Weight 07/01/15 0927 111 lb (50.349 kg)     Height 07/01/15 0927 5\' 7"  (1.702 m)     Head Cir --      Peak Flow --      Pain Score 07/01/15 0944 2     Pain Loc --      Pain Edu? --      Excl. in GC? --     Constitutional: Alert and oriented. Well appearing and in no acute distress. Eyes: Conjunctivae are normal. PERRL. EOMI. Head: Atraumatic. Nose: No congestion/rhinnorhea. Mouth/Throat: Mucous membranes are dry.  Oropharynx non-erythematous. Neck: No stridor.   Cardiovascular: Normal rate, regular rhythm. Grossly normal heart sounds.  Good peripheral circulation. Respiratory: Normal respiratory effort.  No retractions. Lungs CTAB. Gastrointestinal: Soft and nontender. No distention. No abdominal bruits. No CVA tenderness.  Musculoskeletal: No lower extremity tenderness nor edema.  No joint effusions. Neurologic:  Normal speech and language. No gross focal neurologic deficits are appreciated. No gait instability. Skin:  Skin is warm, dry and intact. No rash noted. Patient has poor skin turgor and appears slightly dehydrated. Psychiatric: Mood and affect are normal. Speech and behavior are normal.  ____________________________________________   LABS (all labs ordered are listed, but only abnormal results are displayed)  Labs Reviewed  STOOL CULTURE  CBC  COMPREHENSIVE  METABOLIC PANEL  URINALYSIS COMPLETEWITH MICROSCOPIC ([facility] ONLY)  CREATININE, URINE, RANDOM  SODIUM, URINE, RANDOM   ____________________________________________  EKG  ED ECG REPORT I, Dayon Witt, the attending physician, personally viewed and interpreted this ECG.  Date: 07/01/2015 EKG Time: 10:15 Rate: 75 Rhythm: normal sinus rhythm QRS Axis: normal Intervals: Slight prolonged QT ST/T Wave abnormalities: normal Conduction Disutrbances: none Narrative Interpretation: Normal sinus rhythm, normal T waves, slight prolonged QT  ____________________________________________  RADIOLOGY  DG Chest 2 View (Final result) Result time: 07/01/15 11:27:09   Final result by Rad Results In Interface (07/01/15 11:27:09)   Narrative:   CLINICAL DATA: Dry cough for months. Hyponatremia. Evaluate for nodule or tumor. Smoker.  EXAM: CHEST 2 VIEW  COMPARISON: None.  FINDINGS: Hyperinflation of the lungs. Heart and mediastinal contours are within normal limits. No focal opacities or effusions. No acute bony abnormality.  IMPRESSION: Hyperinflation. No active cardiopulmonary disease.     ____________________________________________   PROCEDURES  Procedure(s) performed: None  Critical Care performed: No  ____________________________________________   INITIAL IMPRESSION / ASSESSMENT AND PLAN / ED COURSE  Pertinent labs & imaging results that were available during my care of the patient were reviewed by me and considered in my medical decision making (see chart for details).    2+ months of occasional loose stools and diarrhea, also not staying well-hydrated at work. She also uses alcohol heavily on a regular basis. Abdomen soft nontender nondistended, and her neurologic exam is normal at the time of evaluation in the ER. She reportedly has a low sodium of 110, likely based on her exam is is due to hypovolemic hyponatremia but we will obtain a sick labs, urinalysis,  and chest x-ray to evaluate for signs of lung mass or tumor. I anticipate the patient will require admission to hospital as her sodium level is adequate critical point as we continue to workup etiology.  Sodium returned at 115. Await urinalysis and FENA, at present I will continue to fluid restrict her as there is the possibility of report of mania and/or polydipsia as an etiology. She does appear slightly dehydrated by exam, but we will await further laboratory evaluation and deferred fluid treatments to hospitalist. Urinalysis still pending at this time. Patient awake alert no distress. ____________________________________________   FINAL CLINICAL IMPRESSION(S) / ED DIAGNOSES  Final diagnoses:  Hyponatremia  Loose stools      Sharyn Creamer, MD 07/01/15 1225

## 2015-07-01 NOTE — ED Notes (Signed)
Pt reports strong smoking history and is a current smoker.  Pt also reports drinking approximately 6 beers per day.

## 2015-07-02 LAB — BASIC METABOLIC PANEL
ANION GAP: 8 (ref 5–15)
Anion gap: 7 (ref 5–15)
BUN: 21 mg/dL — ABNORMAL HIGH (ref 6–20)
BUN: 19 mg/dL (ref 6–20)
CALCIUM: 8.3 mg/dL — AB (ref 8.9–10.3)
CHLORIDE: 87 mmol/L — AB (ref 101–111)
CO2: 26 mmol/L (ref 22–32)
CO2: 25 mmol/L (ref 22–32)
CREATININE: 1.04 mg/dL — AB (ref 0.44–1.00)
Calcium: 8.1 mg/dL — ABNORMAL LOW (ref 8.9–10.3)
Chloride: 93 mmol/L — ABNORMAL LOW (ref 101–111)
Creatinine, Ser: 1.18 mg/dL — ABNORMAL HIGH (ref 0.44–1.00)
GFR calc non Af Amer: 51 mL/min — ABNORMAL LOW (ref >60)
GFR calc non Af Amer: 60 mL/min — ABNORMAL LOW (ref >60)
GFR, EST AFRICAN AMERICAN: 59 mL/min — AB (ref >60)
Glucose, Bld: 117 mg/dL — ABNORMAL HIGH (ref 65–99)
Glucose, Bld: 95 mg/dL (ref 65–99)
POTASSIUM: 4.2 mmol/L (ref 3.5–5.1)
Potassium: 4.4 mmol/L (ref 3.5–5.1)
SODIUM: 121 mmol/L — AB (ref 135–145)
SODIUM: 125 mmol/L — AB (ref 135–145)

## 2015-07-02 LAB — CBC
HEMATOCRIT: 31.3 % — AB (ref 35.0–47.0)
Hemoglobin: 11.1 g/dL — ABNORMAL LOW (ref 12.0–16.0)
MCH: 31.9 pg (ref 26.0–34.0)
MCHC: 35.5 g/dL (ref 32.0–36.0)
MCV: 89.8 fL (ref 80.0–100.0)
Platelets: 281 10*3/uL (ref 150–440)
RBC: 3.49 MIL/uL — ABNORMAL LOW (ref 3.80–5.20)
RDW: 12 % (ref 11.5–14.5)
WBC: 8.5 10*3/uL (ref 3.6–11.0)

## 2015-07-02 LAB — C DIFFICILE QUICK SCREEN W PCR REFLEX
C DIFFICILE (CDIFF) TOXIN: NEGATIVE
C DIFFICLE (CDIFF) ANTIGEN: NEGATIVE
C Diff interpretation: NEGATIVE

## 2015-07-02 MED ORDER — SODIUM CHLORIDE 1 G PO TABS
1.0000 g | ORAL_TABLET | Freq: Three times a day (TID) | ORAL | Status: DC
  Administered 2015-07-02 – 2015-07-03 (×2): 1 g via ORAL
  Filled 2015-07-02 (×5): qty 1

## 2015-07-02 MED ORDER — ZOLPIDEM TARTRATE 5 MG PO TABS
10.0000 mg | ORAL_TABLET | Freq: Every evening | ORAL | Status: DC | PRN
  Administered 2015-07-02: 10 mg via ORAL
  Filled 2015-07-02: qty 2

## 2015-07-02 NOTE — Progress Notes (Signed)
Discussed case with Dr. Allena Katz, he stated consult no longer needed as Na rising.

## 2015-07-02 NOTE — Plan of Care (Signed)
Problem: Discharge Progression Outcomes Goal: Other Discharge Outcomes/Goals Outcome: Progressing VSS. Pt up ad lib.

## 2015-07-02 NOTE — Progress Notes (Signed)
[facility] - [place] at [facility]                                                                                                                                                                                            Patient Demographics   Alisha Franco, is a 54 y.o. female, DOB - 1961/11/07, ZOX:[id number]  Admit date - 07/01/2015   Admitting Physician Shaune Pollack, MD  Outpatient Primary MD for the patient is Carollee Herter, MD   LOS - 1  Subjective: Soon as I walked into the room patient stated that the she does not want to stay in the hospital. And had complaints about not being able to smoke.   Review of Systems:   CONSTITUTIONAL: No documented fever. No fatigue, weakness. No weight gain, no weight loss.  EYES: No blurry or double vision.  ENT: No tinnitus. No postnasal drip. No redness of the oropharynx.  RESPIRATORY: No cough, no wheeze, no hemoptysis. No dyspnea.  CARDIOVASCULAR: No chest pain. No orthopnea. No palpitations. No syncope.  GASTROINTESTINAL: No nausea, no vomiting or diarrhea. No abdominal pain. No melena or hematochezia.  GENITOURINARY: No dysuria or hematuria.  ENDOCRINE: No polyuria or nocturia. No heat or cold intolerance.  HEMATOLOGY: No anemia. No bruising. No bleeding.  INTEGUMENTARY: No rashes. No lesions.  MUSCULOSKELETAL: No arthritis. No swelling. No gout.  NEUROLOGIC: No numbness, tingling, or ataxia. No seizure-type activity.  PSYCHIATRIC: No anxiety. No insomnia. No ADD.    Vitals:   Filed Vitals:   07/01/15 2217 07/01/15 2218 07/02/15 0525 07/02/15 0528  BP:  103/60 118/73 118/73  Pulse: 68 66 64 64  Temp:  98.1 F (36.7 C) 98 F (36.7 C) 98 F (36.7 C)  TempSrc:  Oral Oral Oral  Resp: Height:      Weight:      SpO2: 100% 100% 100% 100%    Wt Readings from Last 3 Encounters:  07/01/15 50.349 kg (111 lb)  06/23/15 52.164 kg (115 lb)     Intake/Output Summary (Last 24 hours) at 07/02/15  1218 Last data filed at 07/02/15 0600  Gross per 24 hour  Intake      0 ml  Output   1900 ml  Net  -1900 ml    Physical Exam:   GENERAL: Pleasant-appearing in no apparent distress.  HEAD, EYES, EARS, NOSE AND THROAT: Atraumatic, normocephalic. Extraocular muscles are intact. Pupils equal and reactive to light. Sclerae anicteric. No conjunctival injection. No oro-pharyngeal erythema.  NECK: Supple. There is no jugular venous distention. No bruits, no lymphadenopathy, no thyromegaly.  HEART: Regular  rate and rhythm,. No murmurs, no rubs, no clicks.  LUNGS: Clear to auscultation bilaterally. No rales or rhonchi. No wheezes.  ABDOMEN: Soft, flat, nontender, nondistended. Has good bowel sounds. No hepatosplenomegaly appreciated.  EXTREMITIES: No evidence of any cyanosis, clubbing, or peripheral edema.  +2 pedal and radial pulses bilaterally.  NEUROLOGIC: The patient is alert, awake, and oriented x3 with no focal motor or sensory deficits appreciated bilaterally.  SKIN: Moist and warm with no rashes appreciated.  Psych: Not anxious, depressed LN: No inguinal LN enlargement    Antibiotics   Anti-infectives    Start     Dose/Rate Route Frequency Ordered Stop   07/01/15 1400  metroNIDAZOLE (FLAGYL) tablet 500 mg     500 mg Oral 3 times per day 07/01/15 1335     07/01/15 1345  ciprofloxacin (CIPRO) tablet 500 mg     500 mg Oral 2 times daily 07/01/15 1335        Medications   Scheduled Meds: . ciprofloxacin  500 mg Oral BID  . folic acid  1 mg Oral Daily  . heparin  5,000 Units Subcutaneous 3 times per day  . metroNIDAZOLE  500 mg Oral 3 times per day  . multivitamin with minerals  1 tablet Oral Daily  . pantoprazole  40 mg Oral Daily  . potassium chloride  40 mEq Oral BID  . thiamine  100 mg Oral Daily   Continuous Infusions: . 0.9 % NaCl with KCl 40 mEq / L 125 mL/hr (07/02/15 1009)  . sodium chloride (hypertonic) 30 mL/hr (07/01/15 2048)   PRN Meds:.acetaminophen **OR**  acetaminophen, albuterol, ondansetron **OR** ondansetron (ZOFRAN) IV, zolpidem   Data Review:   Micro Results No results found for this or any previous visit (from the past 240 hour(s)).  Radiology Reports Dg Chest 1 View  07/01/2015   CLINICAL DATA:  PICC line placement.  EXAM: CHEST  1 VIEW  COMPARISON:  Earlier today at 1053 hours.  FINDINGS: 1917 hours. Left-sided PICC line terminates at the low SVC. Midline trachea. Normal heart size and mediastinal contours. Costophrenic angles minimally excluded. No pleural effusion or pneumothorax. Mild hyperinflation. Numerous leads and wires project over the chest.  IMPRESSION: Left PICC line terminating at low SVC; no pneumothorax.  Hyperinflation, without acute disease.   Electronically Signed   By: Jeronimo Greaves M.D.   On: 07/01/2015 19:51   Dg Chest 2 View  07/01/2015   CLINICAL DATA:  Dry cough for months. Hyponatremia. Evaluate for nodule or tumor. Smoker.  EXAM: CHEST  2 VIEW  COMPARISON:  None.  FINDINGS: Hyperinflation of the lungs. Heart and mediastinal contours are within normal limits. No focal opacities or effusions. No acute bony abnormality.  IMPRESSION: Hyperinflation.  No active cardiopulmonary disease.   Electronically Signed   By: Charlett Nose M.D.   On: 07/01/2015 11:27     CBC  Recent Labs Lab 07/01/15 1012 07/02/15 0510  WBC 10.2 8.5  HGB 13.3 11.1*  HCT 37.0 31.3*  PLT 295 281  MCV 88.9 89.8  MCH 32.0 31.9  MCHC 36.0 35.5  RDW 11.8 12.0    Chemistries   Recent Labs Lab 06/30/15 0001 07/01/15 1012 07/01/15 1531 07/01/15 1532 07/02/15 0104 07/02/15 0510  NA 110* 115* 114*  --  121* 125*  K 4.1 3.1* 2.8*  --  4.2 4.4  CL 71* 70* 72*  --  87* 93*  CO2 22 28 28   --  26 25  GLUCOSE 88 87 168*  --  117* 95  BUN 12 15 16   --  21* 19  CREATININE 0.99 1.43* 1.42*  --  1.18* 1.04*  CALCIUM 9.7 9.6 9.2  --  8.1* 8.3*  MG  --   --   --  1.7  --   --   AST 30 46*  --   --   --   --   ALT 19 29  --   --   --   --    ALKPHOS 94 100  --   --   --   --   BILITOT 1.5* 1.5*  --   --   --   --    ------------------------------------------------------------------------------------------------------------------ estimated creatinine clearance is 49.1 mL/min (by C-G formula based on Cr of 1.04). ------------------------------------------------------------------------------------------------------------------ No results for input(s): HGBA1C in the last 72 hours. ------------------------------------------------------------------------------------------------------------------ No results for input(s): CHOL, HDL, LDLCALC, TRIG, CHOLHDL, LDLDIRECT in the last 72 hours. ------------------------------------------------------------------------------------------------------------------  Recent Labs  07/01/15 1532  TSH 1.422   ------------------------------------------------------------------------------------------------------------------ No results for input(s): VITAMINB12, FOLATE, FERRITIN, TIBC, IRON, RETICCTPCT in the last 72 hours.  Coagulation profile No results for input(s): INR, PROTIME in the last 168 hours.  No results for input(s): DDIMER in the last 72 hours.  Cardiac Enzymes No results for input(s): CKMB, TROPONINI, MYOGLOBIN in the last 168 hours.  Invalid input(s): CK ------------------------------------------------------------------------------------------------------------------ Invalid input(s): POCBNP    Assessment & Plan   1. Severe hyponatremia,  due to diarrhea and alcohol abuse patient's sodiums improved change IV fluids to oral only normal saline stop 3% saline. 2. Diarrhea now improved, stool for C. difficile negative 3. Alcohol abuse  civa Protocol 4. Hypertension HCTZ on hold blood pressure stable 5. Hyperlipidemia (LDL 149) not on any treatment recommend diet 6. Hypokalemia now improved     Code Status Orders        Start     Ordered   07/01/15 1336  Full code    Continuous     07/01/15 1335           Consults  none   DVT Prophylaxis Heparin  Lab Results  Component Value Date   PLT 281 07/02/2015     Time Spent in minutes     Auburn Bilberry M.D on 07/02/2015 at 12:18 PM  Between 7am to 6pm - Pager - [phone]  After 6pm go to [email or web address] - password [email or web address] [facility]  [facility]   Office  [phone]

## 2015-07-02 NOTE — Plan of Care (Addendum)
Problem: Discharge Progression Outcomes Goal: Other Discharge Outcomes/Goals Outcome: Progressing VSS. Na+ 125 in am, hypertonic solution d/c, now on Sodium Chloride tabs. Potassium 4.4. IVF continues. Denies pain/n/v. Tolerated diet. C diff negative, enteric precautions d/c. Continue CIWA, scored low-no medication for alcohol withdrawal given.

## 2015-07-03 LAB — BASIC METABOLIC PANEL
Anion gap: 8 (ref 5–15)
BUN: 10 mg/dL (ref 6–20)
CALCIUM: 8.9 mg/dL (ref 8.9–10.3)
CO2: 21 mmol/L — AB (ref 22–32)
CREATININE: 0.88 mg/dL (ref 0.44–1.00)
Chloride: 102 mmol/L (ref 101–111)
GFR calc non Af Amer: >60 mL/min (ref >60)
Glucose, Bld: 119 mg/dL — ABNORMAL HIGH (ref 65–99)
Potassium: 4.8 mmol/L (ref 3.5–5.1)
Sodium: 131 mmol/L — ABNORMAL LOW (ref 135–145)

## 2015-07-03 MED ORDER — ZOLPIDEM TARTRATE 10 MG PO TABS: 5.0000 mg | ORAL_TABLET | Freq: Every evening | ORAL | Status: DC | PRN

## 2015-07-03 NOTE — Discharge Summary (Signed)
[facility] Phys[facility] - [place] at [facility]   PATIENT NAME: Alisha Franco    MR#:  [id number]  DATE OF BIRTH:  May 12, 1961  DATE OF ADMISSION:  07/01/2015 ADMITTING PHYSICIAN: Shaune Pollack, MD  DATE OF DISCHARGE: 07/03/2015 11:40 AM  PRIMARY CARE PHYSICIAN: Carollee Herter, MD    ADMISSION DIAGNOSIS:  Hyponatremia [E87.1] Loose stools [R19.5]   DISCHARGE DIAGNOSIS:   Hyponatremia SECONDARY DIAGNOSIS:   Past Medical History  Diagnosis Date  . Hypertension     HOSPITAL COURSE:   1. Severe hyponatremia, due to diarrhea and alcohol abuse. The patient's sodiums has been much improving after 3% saline and normal saline treatment. Sodium increased from 110 to 131. Patient has no symptoms.  2. Diarrhea now improved, stool for C. difficile negative 3. Alcohol abuse She is on CIWA Protocol. No evidence of withdrawal. 4. Hypertension HCTZ on hold blood pressure stable 5. Hyperlipidemia (LDL 149) not on any treatment recommend diet 6. Hypokalemia now improved  DISCHARGE CONDITIONS:   Stable, discharged to home today.  CONSULTS OBTAINED:  Treatment Team:  Shaune Pollack, MD  DRUG ALLERGIES:  No Known Allergies  DISCHARGE MEDICATIONS:   Discharge Medication List as of 07/03/2015 10:26 AM    START taking these medications   Details  zolpidem (AMBIEN) 10 MG tablet Take 0.5 tablets (5 mg total) by mouth at bedtime as needed for sleep., Starting 07/03/2015, Until Discontinued, Print      CONTINUE these medications which have NOT CHANGED   Details  esomeprazole (NEXIUM) 40 MG capsule Take 40 mg by mouth daily at 12 noon., Until Discontinued, Historical Med         DISCHARGE INSTRUCTIONS:     If you experience worsening of your admission symptoms, develop shortness of breath, life threatening emergency, suicidal or homicidal thoughts you must seek medical attention immediately by calling 911 or calling your MD immediately  if symptoms less severe.  You  Must read complete instructions/literature along with all the possible adverse reactions/side effects for all the Medicines you take and that have been prescribed to you. Take any new Medicines after you have completely understood and accept all the possible adverse reactions/side effects.   Please note  You were cared for by a hospitalist during your hospital stay. If you have any questions about your discharge medications or the care you received while you were in the hospital after you are discharged, you can call the unit and asked to speak with the hospitalist on call if the hospitalist that took care of you is not available. Once you are discharged, your primary care physician will handle any further medical issues. Please note that NO REFILLS for any discharge medications will be authorized once you are discharged, as it is imperative that you return to your primary care physician (or establish a relationship with a primary care physician if you do not have one) for your aftercare needs so that they can reassess your need for medications and monitor your lab values.    Today   SUBJECTIVE   No complaint   VITAL SIGNS:  Blood pressure 103/60, pulse 90, temperature 98.2 F (36.8 C), temperature source Oral, resp. rate 20, height 5\' 7"  (1.702 m), weight 50.349 kg (111 lb), SpO2 95 %.  I/O:   Intake/Output Summary (Last 24 hours) at 07/03/15 1710 Last data filed at 07/02/15 1800  Gross per 24 hour  Intake   1600 ml  Output      0 ml  Net  1600 ml    PHYSICAL EXAMINATION:  GENERAL:  54 y.o.-year-old patient lying in the bed with no acute distress.  EYES: Pupils equal, round, reactive to light and accommodation. No scleral icterus. Extraocular muscles intact.  HEENT: Head atraumatic, normocephalic. Oropharynx and nasopharynx clear.  NECK:  Supple, no jugular venous distention. No thyroid enlargement, no tenderness.  LUNGS: Normal breath sounds bilaterally, no wheezing,  rales,rhonchi or crepitation. No use of accessory muscles of respiration.  CARDIOVASCULAR: S1, S2 normal. No murmurs, rubs, or gallops.  ABDOMEN: Soft, non-tender, non-distended. Bowel sounds present. No organomegaly or mass.  EXTREMITIES: No pedal edema, cyanosis, or clubbing.  NEUROLOGIC: Cranial nerves II through XII are intact. Muscle strength 5/5 in all extremities. Sensation intact. Gait not checked.  PSYCHIATRIC: The patient is alert and oriented x 3.  SKIN: No obvious rash, lesion, or ulcer.   DATA REVIEW:   CBC  Recent Labs Lab 07/02/15 0510  WBC 8.5  HGB 11.1*  HCT 31.3*  PLT 281    Chemistries   Recent Labs Lab 07/01/15 1012  07/01/15 1532  07/03/15 0541  NA 115*  < >  --   < > 131*  K 3.1*  < >  --   < > 4.8  CL 70*  < >  --   < > 102  CO2 28  < >  --   < > 21*  GLUCOSE 87  < >  --   < > 119*  BUN 15  < >  --   < > 10  CREATININE 1.43*  < >  --   < > 0.88  CALCIUM 9.6  < >  --   < > 8.9  MG  --   --  1.7  --   --   AST 46*  --   --   --   --   ALT 29  --   --   --   --   ALKPHOS 100  --   --   --   --   BILITOT 1.5*  --   --   --   --   < > = values in this interval not displayed.  Cardiac Enzymes No results for input(s): TROPONINI in the last 168 hours.  Microbiology Results  Results for orders placed or performed during the hospital encounter of 07/01/15  Stool culture     Status: None (Preliminary result)   Collection Time: 07/02/15 11:17 AM  Result Value Ref Range Status   Specimen Description STOOL  Final   Special Requests Normal  Final   Culture   Final    NO SALMONELLA OR SHIGELLA ISOLATED No Pathogenic E. coli detected    Report Status PENDING  Incomplete  C difficile quick scan w PCR reflex     Status: None   Collection Time: 07/02/15 11:17 AM  Result Value Ref Range Status   C Diff antigen NEGATIVE NEGATIVE Final   C Diff toxin NEGATIVE NEGATIVE Final   C Diff interpretation Negative for C. difficile  Final    RADIOLOGY:  Dg  Chest 1 View  07/01/2015   CLINICAL DATA:  PICC line placement.  EXAM: CHEST  1 VIEW  COMPARISON:  Earlier today at 1053 hours.  FINDINGS: 1917 hours. Left-sided PICC line terminates at the low SVC. Midline trachea. Normal heart size and mediastinal contours. Costophrenic angles minimally excluded. No pleural effusion or pneumothorax. Mild hyperinflation. Numerous leads and wires project over the chest.  IMPRESSION:  Left PICC line terminating at low SVC; no pneumothorax.  Hyperinflation, without acute disease.   Electronically Signed   By: Jeronimo Greaves M.D.   On: 07/01/2015 19:51        Management plans discussed with the patient, family and they are in agreement.  CODE STATUS:   TOTAL TIME TAKING CARE OF THIS PATIENT: 35 minutes.    Shaune Pollack M.D on 07/03/2015 at 5:10 PM  Between 7am to 6pm - Pager - [phone]  After 6pm go to [email or web address] - password [email or web address] [facility]  [facility]  Office  [phone]  CC: Primary care physician; Carollee Herter, MD

## 2015-07-03 NOTE — Plan of Care (Signed)
Problem: Discharge Progression Outcomes Goal: Other Discharge Outcomes/Goals Outcome: Progressing Plan of care progress to goal: Hemodynamically stable - VSS Activity - pt up ad lib Pt denies that her daily drinking of alcohol is attributing to her restlessness.

## 2015-07-03 NOTE — Progress Notes (Signed)
Pt complains of severe insomnia.   Ambien did not help her sleep last night.  Dr. Allena Katz ordered  Ambien at bedtime to see if this will help.

## 2015-07-03 NOTE — Discharge Instructions (Signed)
Regular diet. °Activity as tolerated. °Smoking cessation. °

## 2015-07-03 NOTE — Progress Notes (Signed)
[facility] - [place] at [facility] was admitted to the Hospital on 07/01/2015 and Discharged  07/03/2015 and should be excused from work/school   for 3 days starting 07/01/2015 , may be off work for 2 days after discharge and then return to work/school without any restrictions.  Shaune Pollack MD.  Shaune Pollack M.D on 07/03/2015,at 10:08 AM  [facility] -  at [facility]  [phone]

## 2015-07-04 ENCOUNTER — Telehealth: Payer: Self-pay | Admitting: Family Medicine

## 2015-07-04 LAB — STOOL CULTURE: SPECIAL REQUESTS: NORMAL

## 2015-07-04 NOTE — Telephone Encounter (Signed)
Pt called back and stated that she is doing much better. She is not drinking and is able to eat now. She states that the hospital gave her nexium and [place]. She states that she has been on these medication before so knows about side effects. She states that she is not experiencing any side effects at this time. Pt also stated that she has filled her medications and has them at home. Pt was reminded that the hospital made her a follow up appt here and she stated she would be here for that. Pt was told to bring all her medications with her. Pt asked some general questions concerning her labs and was told that these would be discussed at her follow up appt. Pt was reminded of the phone number here at the office and told to call if she had any issues. Pt was also told if any issues came up that she was concerned about to go to the ER. Pt verbalized understanding.

## 2015-07-04 NOTE — Telephone Encounter (Signed)
Left message for pt to call back, needing a hospital follow up phone call

## 2015-07-08 LAB — O&P RESULT

## 2015-07-08 LAB — GIARDIA, EIA; OVA/PARASITE: GIARDIA AG STL: NEGATIVE

## 2015-07-10 ENCOUNTER — Telehealth: Payer: Self-pay | Admitting: Family Medicine

## 2015-07-10 NOTE — Telephone Encounter (Signed)
Left message for pt to call me back 

## 2015-07-10 NOTE — Telephone Encounter (Signed)
Pt called and recently went to the hospital and the dr gave her 5 tablets  ambiem and told her to take half of one and she wants a refill for it says the half of the pill dosent help her sleep throught the night and wants to take a whole one at night time. Pt can be reached at [phone]. Please let her know when they are ready

## 2015-07-10 NOTE — Telephone Encounter (Signed)
We can discuss this when she comes in for an appointment

## 2015-07-11 NOTE — Telephone Encounter (Signed)
Pt notified that when she comes in Monday for her appt that she can discuss the sleep med then

## 2015-07-14 ENCOUNTER — Encounter: Payer: Self-pay | Admitting: Family Medicine

## 2015-07-14 ENCOUNTER — Ambulatory Visit (INDEPENDENT_AMBULATORY_CARE_PROVIDER_SITE_OTHER): Payer: BLUE CROSS/BLUE SHIELD | Admitting: Family Medicine

## 2015-07-14 ENCOUNTER — Other Ambulatory Visit: Payer: Self-pay

## 2015-07-14 VITALS — BP 150/100 | HR 107 | Wt 113.4 lb

## 2015-07-14 DIAGNOSIS — E871 Hypo-osmolality and hyponatremia: Secondary | ICD-10-CM

## 2015-07-14 DIAGNOSIS — I10 Essential (primary) hypertension: Secondary | ICD-10-CM | POA: Diagnosis not present

## 2015-07-14 DIAGNOSIS — Z8659 Personal history of other mental and behavioral disorders: Secondary | ICD-10-CM | POA: Diagnosis not present

## 2015-07-14 DIAGNOSIS — G479 Sleep disorder, unspecified: Secondary | ICD-10-CM

## 2015-07-14 DIAGNOSIS — F1010 Alcohol abuse, uncomplicated: Secondary | ICD-10-CM

## 2015-07-14 LAB — COMPREHENSIVE METABOLIC PANEL
ALK PHOS: 70 U/L (ref 33–130)
ALT: 22 U/L (ref 6–29)
AST: 17 U/L (ref 10–35)
Albumin: 4.2 g/dL (ref 3.6–5.1)
BUN: 6 mg/dL — ABNORMAL LOW (ref 7–25)
CALCIUM: 9.7 mg/dL (ref 8.6–10.4)
CO2: 25 mmol/L (ref 20–31)
Chloride: 92 mmol/L — ABNORMAL LOW (ref 98–110)
Creat: 0.64 mg/dL (ref 0.50–1.05)
GLUCOSE: 95 mg/dL (ref 65–99)
POTASSIUM: 4.2 mmol/L (ref 3.5–5.3)
Sodium: 128 mmol/L — ABNORMAL LOW (ref 135–146)
Total Bilirubin: 0.4 mg/dL (ref 0.2–1.2)
Total Protein: 7.1 g/dL (ref 6.1–8.1)

## 2015-07-14 LAB — CBC WITH DIFFERENTIAL/PLATELET
Basophils Absolute: 0.1 10*3/uL (ref 0.0–0.1)
Basophils Relative: 1 % (ref 0–1)
EOS PCT: 0 % (ref 0–5)
Eosinophils Absolute: 0 10*3/uL (ref 0.0–0.7)
HEMATOCRIT: 32.7 % — AB (ref 36.0–46.0)
Hemoglobin: 11.9 g/dL — ABNORMAL LOW (ref 12.0–15.0)
LYMPHS ABS: 3.6 10*3/uL (ref 0.7–4.0)
LYMPHS PCT: 28 % (ref 12–46)
MCH: 32.7 pg (ref 26.0–34.0)
MCHC: 36.4 g/dL — AB (ref 30.0–36.0)
MCV: 89.8 fL (ref 78.0–100.0)
MONO ABS: 0.9 10*3/uL (ref 0.1–1.0)
MPV: 7.8 fL — ABNORMAL LOW (ref 8.6–12.4)
Monocytes Relative: 7 % (ref 3–12)
Neutro Abs: 8.1 10*3/uL — ABNORMAL HIGH (ref 1.7–7.7)
Neutrophils Relative %: 64 % (ref 43–77)
Platelets: 424 10*3/uL — ABNORMAL HIGH (ref 150–400)
RBC: 3.64 MIL/uL — ABNORMAL LOW (ref 3.87–5.11)
RDW: 12.2 % (ref 11.5–15.5)
WBC: 12.7 10*3/uL — ABNORMAL HIGH (ref 4.0–10.5)

## 2015-07-14 MED ORDER — ZOLPIDEM TARTRATE 10 MG PO TABS: 10.0000 mg | ORAL_TABLET | Freq: Every day | ORAL | Status: DC

## 2015-07-14 MED ORDER — AMLODIPINE BESYLATE 5 MG PO TABS
5.0000 mg | ORAL_TABLET | Freq: Every day | ORAL | Status: DC
Start: 2015-07-14 — End: 2021-07-28

## 2015-07-14 NOTE — Progress Notes (Signed)
   Subjective:    Patient ID: Alisha Franco, female    DOB: 22-Jan-1961, 54 y.o.   MRN: [id number]  HPI He is here for posthospitalization follow-up. She was admitted for severe hyponatremia and rehydrated. The emergency room record was reviewed. She states she is doing much better and eating more regularly. She does state that she's had 5 beers within the last 2 weeks. She complains of difficulty with sleep and blames this on stress. She states that she has had difficulty dealing with her entire life. She is no longer on blood pressure medications stating she does not want to go on a diaphoretic again. She would like more Ambien stating this helps with sleep and blames asleep on her anxiety. Further discussion with her indicates she is not interested in counseling.   Review of Systems     Objective:   Physical Exam Alert and in no distress. Cardiac exam shows regular rhythm without murmurs or gallop a lungs are clear to auscultation. Abdominal exam shows no masses or tenderness.       Assessment & Plan:  Alcohol abuse - Plan: CBC with Differential/Platelet, Comprehensive metabolic panel  Essential hypertension - Plan: amLODipine (NORVASC) 5 MG tablet  History of depression  Sleep disturbance - Plan: zolpidem (AMBIEN) 10 MG tablet  Hyponatremia - Plan: CBC with Differential/Platelet, Comprehensive metabolic panel This was a very contentious interaction. She seemed angry especially when I discussed getting her back involved in counseling. She did state she might possibly see a psychiatrist in the future. Discussed the use of Ambien with her explaining that I did not want to keep her on this for an extended period time. If she is having difficulty with stress and anxiety keeping her from sleeping the last what needs to be dealt with not give her sleeping pill. I again stressed the need for her to avoid all alcohol. Over 25 minutes, greater than 50% spent in counseling and coordination of  care.

## 2015-07-15 ENCOUNTER — Telehealth: Payer: Self-pay | Admitting: Family Medicine

## 2015-07-15 NOTE — Telephone Encounter (Signed)
In chart to look up lab results for Dr. Susann Givens

## 2015-07-15 NOTE — Addendum Note (Signed)
Addended by: Ronnald Nian on: 07/15/2015 08:28 AM   Modules accepted: Orders

## 2015-07-17 ENCOUNTER — Telehealth: Payer: Self-pay

## 2015-07-17 ENCOUNTER — Telehealth: Payer: Self-pay | Admitting: Family Medicine

## 2015-07-17 ENCOUNTER — Other Ambulatory Visit: Payer: Self-pay

## 2015-07-17 DIAGNOSIS — R899 Unspecified abnormal finding in specimens from other organs, systems and tissues: Secondary | ICD-10-CM

## 2015-07-17 NOTE — Telephone Encounter (Signed)
Called pt to inform her that Dr.Lalonde wants her to come in for an am draw for BMET stat and Cortisol stat  On Friday morning patient was inform of labs needed and informed of last labs patient began to say she is not drinking and she is eating good and that she was tired of having blood drawn and if this lab work doesn't show nothing more she will go somewhere else that no one has checked her stomach and that is where she believes where the problem is but she said she would be here 07/18/15 between 8:30 am to 10 am to have blood work done ( I have placed orders in system)

## 2015-07-17 NOTE — Telephone Encounter (Signed)
Pt come in to office . And was concerned pt would like her kidney, liver, and stomach checked for bacteria. Pt dosent really want to be checked for acholishm she does drink and that the dr is aware of her drinking,

## 2015-07-17 NOTE — Telephone Encounter (Signed)
I need the stat b metand cortisol level. She can make an appointment and we can discuss this bacteria issue that she is concerned about

## 2015-07-17 NOTE — Telephone Encounter (Signed)
Dr.Lalonde did you see this 

## 2015-07-18 ENCOUNTER — Telehealth: Payer: Self-pay | Admitting: Medical

## 2015-07-18 ENCOUNTER — Other Ambulatory Visit: Payer: BLUE CROSS/BLUE SHIELD

## 2015-07-18 DIAGNOSIS — R899 Unspecified abnormal finding in specimens from other organs, systems and tissues: Secondary | ICD-10-CM

## 2015-07-18 LAB — BASIC METABOLIC PANEL
BUN: 7 mg/dL (ref 7–25)
CHLORIDE: 99 mmol/L (ref 98–110)
CO2: 23 mmol/L (ref 20–31)
CREATININE: 0.7 mg/dL (ref 0.50–1.05)
Calcium: 9.3 mg/dL (ref 8.6–10.4)
GLUCOSE: 126 mg/dL — AB (ref 65–99)
Potassium: 4 mmol/L (ref 3.5–5.3)
Sodium: 134 mmol/L — ABNORMAL LOW (ref 135–146)

## 2015-07-18 LAB — CORTISOL: Cortisol, Plasma: 11.8 ug/dL

## 2015-07-18 NOTE — Telephone Encounter (Signed)
Please call patient.  Her sodium is 134, much improved.   Rest of labs ok.  See if she has any other concerns currently?     Regarding the belly pain/bacteria concern, please ask her to make appt with Dr. Susann Givens to evaluate this further.

## 2015-07-18 NOTE — Telephone Encounter (Signed)
Called patient with lab results. Advised her make appoint to discuss high blood glucose level and discuss labs with MD.

## 2015-07-24 ENCOUNTER — Institutional Professional Consult (permissible substitution): Payer: BLUE CROSS/BLUE SHIELD | Admitting: Family Medicine

## 2015-07-29 ENCOUNTER — Telehealth: Payer: Self-pay

## 2015-07-29 ENCOUNTER — Encounter: Payer: Self-pay | Admitting: Family Medicine

## 2015-07-29 ENCOUNTER — Ambulatory Visit (INDEPENDENT_AMBULATORY_CARE_PROVIDER_SITE_OTHER): Payer: BLUE CROSS/BLUE SHIELD | Admitting: Family Medicine

## 2015-07-29 VITALS — BP 160/90 | HR 100 | Ht 67.0 in | Wt 113.0 lb

## 2015-07-29 DIAGNOSIS — R69 Illness, unspecified: Secondary | ICD-10-CM

## 2015-07-29 DIAGNOSIS — IMO0001 Reserved for inherently not codable concepts without codable children: Secondary | ICD-10-CM

## 2015-07-29 NOTE — Telephone Encounter (Signed)
error 

## 2015-07-29 NOTE — Progress Notes (Signed)
   Subjective:    Patient ID: Alisha Franco, female    DOB: 08/20/1961, 54 y.o.   MRN: [id number]  HPI He is here for a consult. She still has concerns over her stomach hitting she feels bloated and having cramping but no vomiting or diarrhea and does note change in her stool. She also stated she was not interested in having anymore blood work done. Her demeanor was very often patient all and she seemed to be quite irritated. She stated to my nurse that she did not plan on coming back. I told her that that would be fine. I explained that she does need to be on another blood pressure medication and need to be referred to a strut urologist. She stated she would check with her insurance and take care of that.   Review of Systems     Objective:   Physical Exam        Assessment & Plan:

## 2015-07-31 ENCOUNTER — Encounter: Payer: Self-pay | Admitting: Family Medicine

## 2018-05-31 DIAGNOSIS — Z131 Encounter for screening for diabetes mellitus: Secondary | ICD-10-CM | POA: Diagnosis not present

## 2018-05-31 DIAGNOSIS — Z13220 Encounter for screening for lipoid disorders: Secondary | ICD-10-CM | POA: Diagnosis not present

## 2018-05-31 DIAGNOSIS — R202 Paresthesia of skin: Secondary | ICD-10-CM | POA: Diagnosis not present

## 2018-05-31 DIAGNOSIS — I10 Essential (primary) hypertension: Secondary | ICD-10-CM | POA: Diagnosis not present

## 2018-07-05 DIAGNOSIS — R000 Tachycardia, unspecified: Secondary | ICD-10-CM | POA: Diagnosis not present

## 2018-07-05 DIAGNOSIS — F17200 Nicotine dependence, unspecified, uncomplicated: Secondary | ICD-10-CM | POA: Diagnosis not present

## 2018-07-05 DIAGNOSIS — I158 Other secondary hypertension: Secondary | ICD-10-CM | POA: Diagnosis not present

## 2018-07-05 DIAGNOSIS — F1010 Alcohol abuse, uncomplicated: Secondary | ICD-10-CM | POA: Diagnosis not present

## 2018-07-24 DIAGNOSIS — I160 Hypertensive urgency: Secondary | ICD-10-CM | POA: Diagnosis not present

## 2018-07-24 DIAGNOSIS — Z1159 Encounter for screening for other viral diseases: Secondary | ICD-10-CM | POA: Diagnosis not present

## 2018-07-24 DIAGNOSIS — F17200 Nicotine dependence, unspecified, uncomplicated: Secondary | ICD-10-CM | POA: Diagnosis not present

## 2018-07-24 DIAGNOSIS — R202 Paresthesia of skin: Secondary | ICD-10-CM | POA: Diagnosis not present

## 2018-07-24 DIAGNOSIS — I158 Other secondary hypertension: Secondary | ICD-10-CM | POA: Diagnosis not present

## 2018-07-24 DIAGNOSIS — R000 Tachycardia, unspecified: Secondary | ICD-10-CM | POA: Diagnosis not present

## 2018-07-24 DIAGNOSIS — R200 Anesthesia of skin: Secondary | ICD-10-CM | POA: Diagnosis not present

## 2018-08-04 DIAGNOSIS — R200 Anesthesia of skin: Secondary | ICD-10-CM | POA: Diagnosis not present

## 2018-08-07 DIAGNOSIS — R000 Tachycardia, unspecified: Secondary | ICD-10-CM | POA: Diagnosis not present

## 2018-08-07 DIAGNOSIS — I159 Secondary hypertension, unspecified: Secondary | ICD-10-CM | POA: Diagnosis not present

## 2018-08-07 DIAGNOSIS — F1010 Alcohol abuse, uncomplicated: Secondary | ICD-10-CM | POA: Diagnosis not present

## 2018-08-07 DIAGNOSIS — Z780 Asymptomatic menopausal state: Secondary | ICD-10-CM | POA: Diagnosis not present

## 2018-08-22 DIAGNOSIS — I499 Cardiac arrhythmia, unspecified: Secondary | ICD-10-CM | POA: Diagnosis not present

## 2018-08-22 DIAGNOSIS — R200 Anesthesia of skin: Secondary | ICD-10-CM | POA: Diagnosis not present

## 2018-08-24 ENCOUNTER — Other Ambulatory Visit: Payer: Self-pay | Admitting: Internal Medicine

## 2018-08-24 DIAGNOSIS — Z780 Asymptomatic menopausal state: Secondary | ICD-10-CM

## 2018-10-25 ENCOUNTER — Other Ambulatory Visit: Payer: BLUE CROSS/BLUE SHIELD

## 2018-10-30 ENCOUNTER — Ambulatory Visit
Admission: RE | Admit: 2018-10-30 | Discharge: 2018-10-30 | Disposition: A | Discharge status: Home / Self Care | Payer: BLUE CROSS/BLUE SHIELD | Source: Ambulatory Visit | Attending: Internal Medicine | Admitting: Internal Medicine

## 2018-10-30 ENCOUNTER — Other Ambulatory Visit: Payer: Self-pay | Admitting: Internal Medicine

## 2018-10-30 DIAGNOSIS — M79622 Pain in left upper arm: Secondary | ICD-10-CM

## 2018-10-30 DIAGNOSIS — I160 Hypertensive urgency: Secondary | ICD-10-CM | POA: Diagnosis not present

## 2018-10-30 DIAGNOSIS — F1010 Alcohol abuse, uncomplicated: Secondary | ICD-10-CM | POA: Diagnosis not present

## 2018-10-30 DIAGNOSIS — F320 Major depressive disorder, single episode, mild: Secondary | ICD-10-CM | POA: Diagnosis not present

## 2018-10-30 DIAGNOSIS — M25512 Pain in left shoulder: Secondary | ICD-10-CM | POA: Diagnosis not present

## 2018-11-27 ENCOUNTER — Other Ambulatory Visit: Payer: BLUE CROSS/BLUE SHIELD

## 2019-10-26 IMAGING — CR DG SHOULDER 2+V*L*
3 series · 3 of 3 positions shown · non-contrast
Comparison: None.

CLINICAL DATA: Chronic left shoulder/upper arm pain.

EXAM:
LEFT SHOULDER - 2+ VIEW

[w shoulder ap internal left]
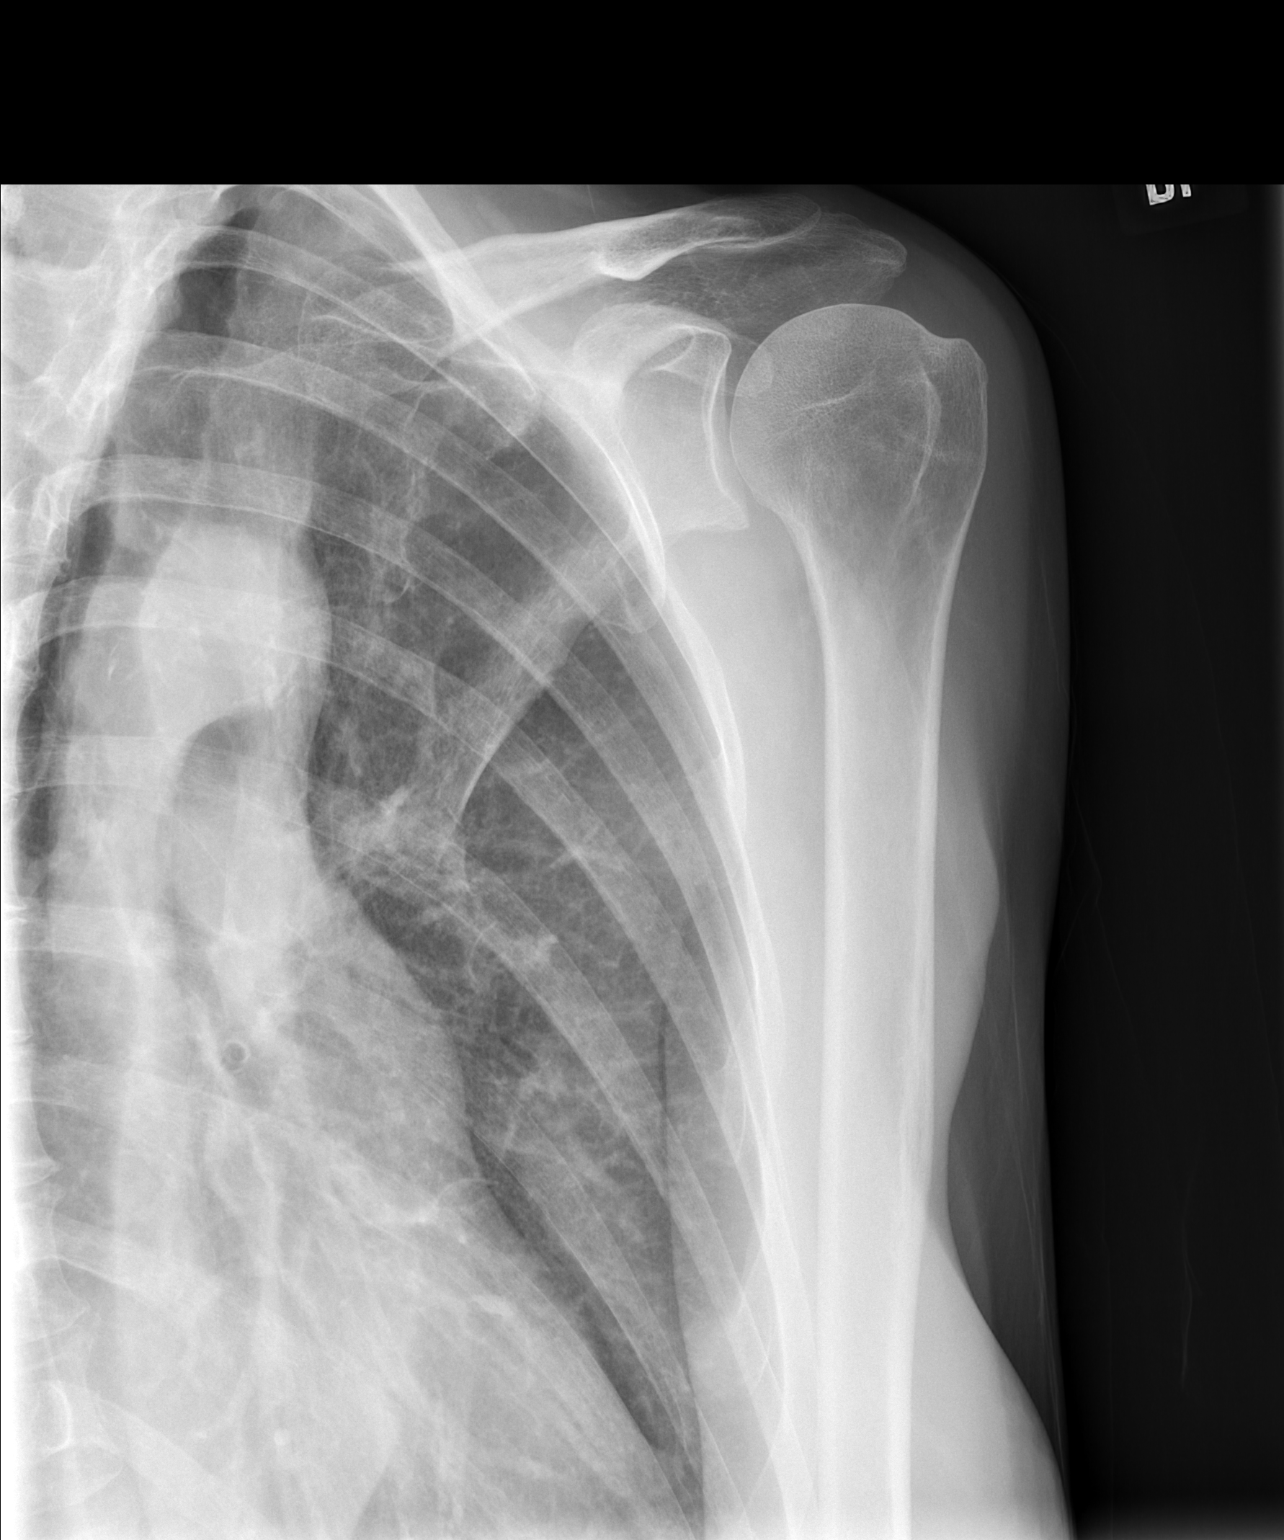

[w shoulder y view left]
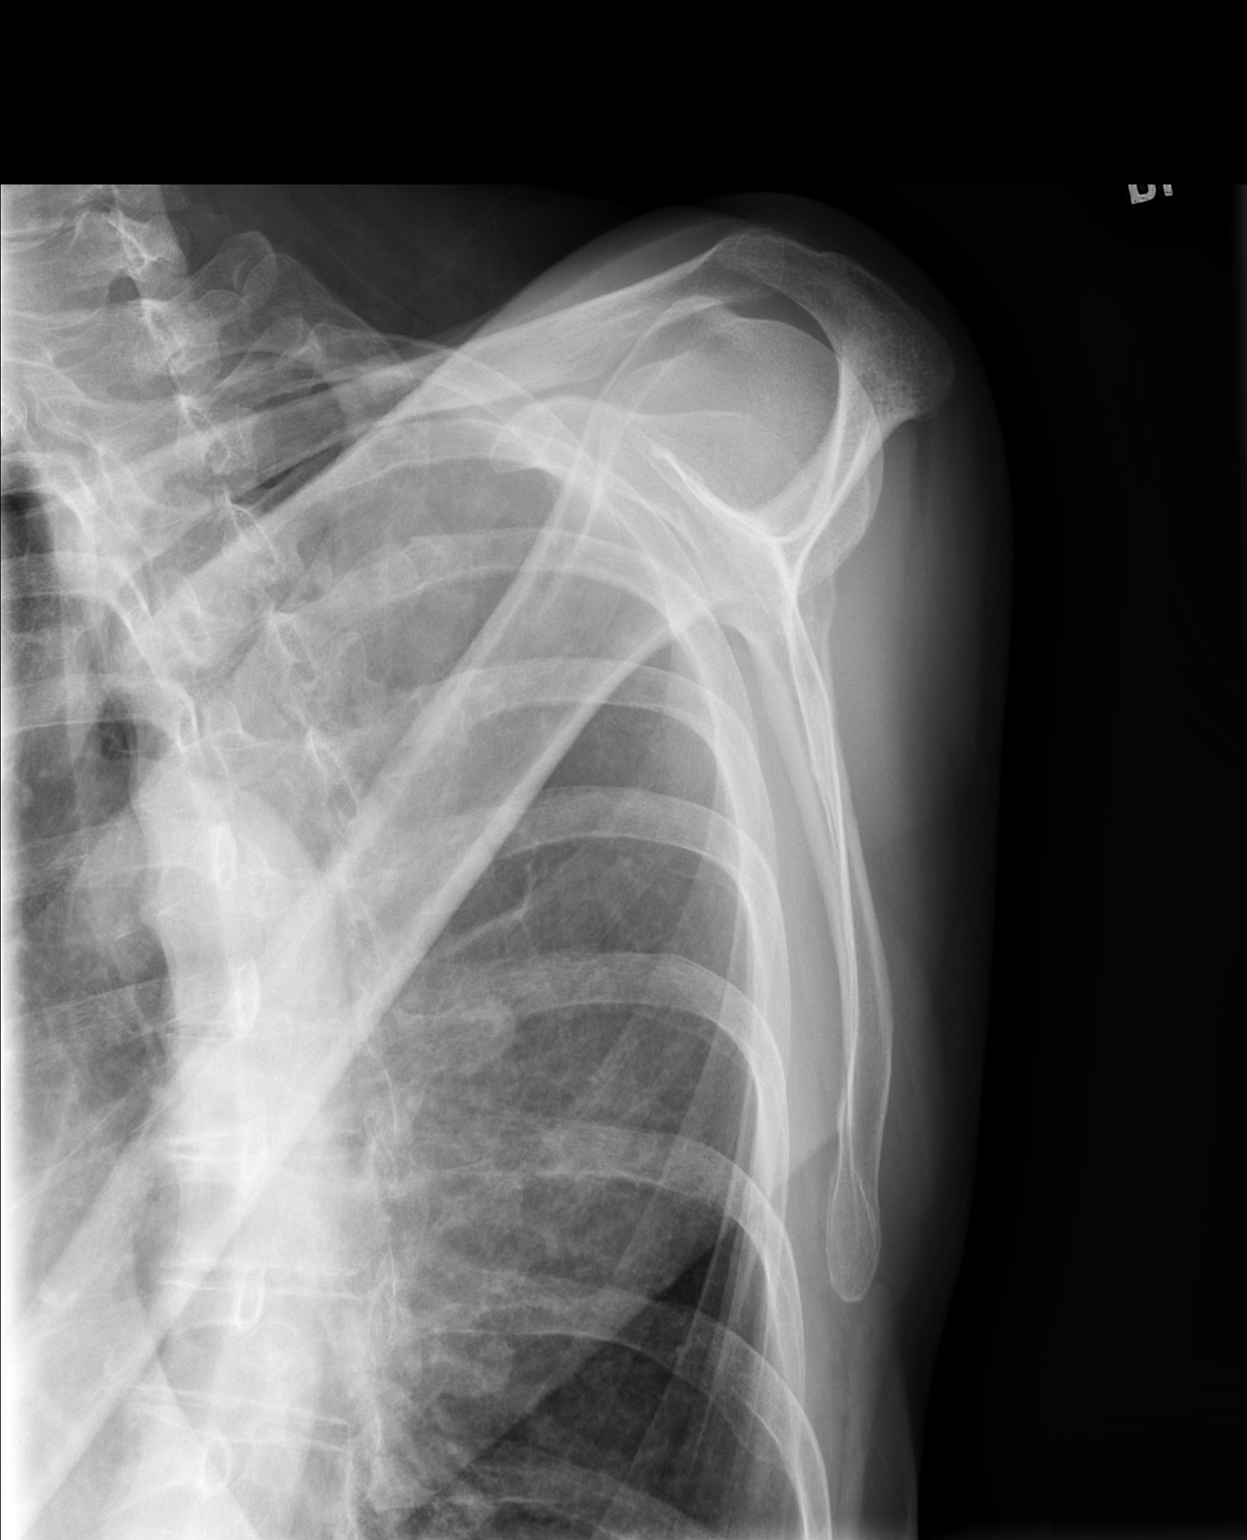

[w shoulder axillary left *]
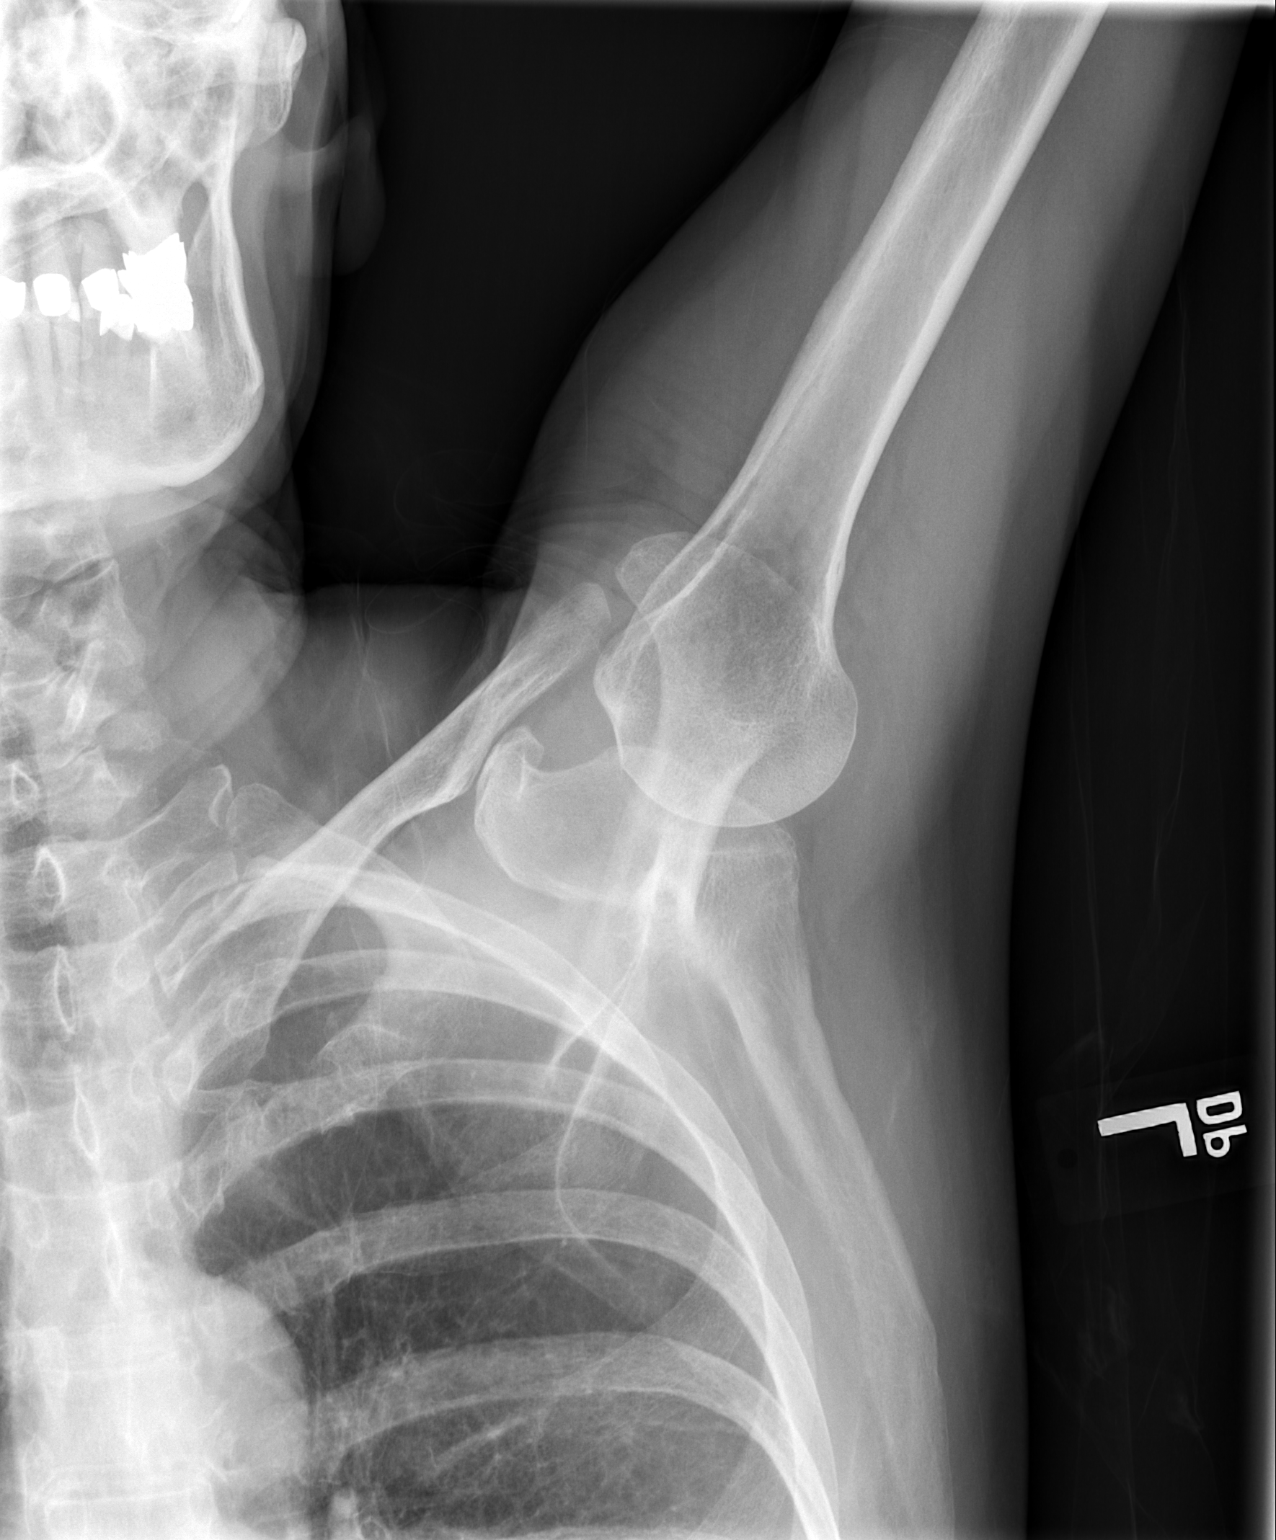

[3 of 3 positions shown; findings below may reference images not displayed]

FINDINGS: No acute fracture or significant arthropathic changes are identified
about the left shoulder. There is an old left ninth rib fracture.
The visualized portion of the left lung is clear. The soft tissues
are unremarkable.
IMPRESSION: No acute osseous abnormality or significant arthropathic changes
about the left shoulder.

## 2020-10-01 ENCOUNTER — Other Ambulatory Visit: Payer: Self-pay | Admitting: Internal Medicine

## 2020-10-01 ENCOUNTER — Ambulatory Visit
Admission: RE | Admit: 2020-10-01 | Discharge: 2020-10-01 | Disposition: A | Discharge status: Home / Self Care | Payer: BC Managed Care – PPO | Source: Ambulatory Visit | Attending: Internal Medicine | Admitting: Internal Medicine

## 2020-10-01 DIAGNOSIS — Z13220 Encounter for screening for lipoid disorders: Secondary | ICD-10-CM | POA: Diagnosis not present

## 2020-10-01 DIAGNOSIS — E559 Vitamin D deficiency, unspecified: Secondary | ICD-10-CM | POA: Diagnosis not present

## 2020-10-01 DIAGNOSIS — R059 Cough, unspecified: Secondary | ICD-10-CM

## 2020-10-01 DIAGNOSIS — Z0000 Encounter for general adult medical examination without abnormal findings: Secondary | ICD-10-CM | POA: Diagnosis not present

## 2020-10-08 ENCOUNTER — Other Ambulatory Visit: Payer: Self-pay | Admitting: Internal Medicine

## 2020-10-08 DIAGNOSIS — Z1231 Encounter for screening mammogram for malignant neoplasm of breast: Secondary | ICD-10-CM

## 2020-10-15 DIAGNOSIS — I160 Hypertensive urgency: Secondary | ICD-10-CM | POA: Diagnosis not present

## 2020-10-15 DIAGNOSIS — E559 Vitamin D deficiency, unspecified: Secondary | ICD-10-CM | POA: Diagnosis not present

## 2020-11-26 ENCOUNTER — Ambulatory Visit: Payer: BC Managed Care – PPO

## 2021-01-02 ENCOUNTER — Ambulatory Visit: Payer: BC Managed Care – PPO

## 2021-06-23 ENCOUNTER — Other Ambulatory Visit: Payer: Self-pay

## 2021-06-23 ENCOUNTER — Ambulatory Visit (HOSPITAL_COMMUNITY)
Admission: EM | Admit: 2021-06-23 | Discharge: 2021-06-23 | Disposition: A | Discharge status: Home / Self Care | Payer: No Payment, Other | Attending: Psychiatry | Admitting: Psychiatry

## 2021-06-23 DIAGNOSIS — Z9151 Personal history of suicidal behavior: Secondary | ICD-10-CM | POA: Diagnosis not present

## 2021-06-23 DIAGNOSIS — F332 Major depressive disorder, recurrent severe without psychotic features: Secondary | ICD-10-CM | POA: Diagnosis not present

## 2021-06-23 DIAGNOSIS — F1020 Alcohol dependence, uncomplicated: Secondary | ICD-10-CM | POA: Insufficient documentation

## 2021-06-23 DIAGNOSIS — F322 Major depressive disorder, single episode, severe without psychotic features: Secondary | ICD-10-CM

## 2021-06-23 DIAGNOSIS — F17210 Nicotine dependence, cigarettes, uncomplicated: Secondary | ICD-10-CM | POA: Insufficient documentation

## 2021-06-23 DIAGNOSIS — Z560 Unemployment, unspecified: Secondary | ICD-10-CM | POA: Insufficient documentation

## 2021-06-23 DIAGNOSIS — F419 Anxiety disorder, unspecified: Secondary | ICD-10-CM | POA: Insufficient documentation

## 2021-06-23 NOTE — BH Assessment (Signed)
Comprehensive Clinical Assessment (CCA) Note  06/23/2021 Alisha DanaSabine Shimada [id number]  Disposition: Per Park PopeAndrew Ji, MD, patient is psychiatrically cleared and recommended for discharge.   Flowsheet Row [facility]  C-SSRS RISK CATEGORY Moderate Risk      The patient demonstrates the following risk factors for suicide: Chronic risk factors for suicide include: psychiatric disorder of MDD, substance use disorder, previous suicide attempts x3, previous self-harm cutting, and demographic factors (female, 74>60 y/o). Acute risk factors for suicide include: unemployment, social withdrawal/isolation, and loss (financial, interpersonal, professional). Protective factors for this patient include: positive social support and hope for the future. Considering these factors, the overall suicide risk at this point appears to be moderate. Patient is not appropriate for outpatient follow up.  Alisha Franco is a 60 year old female [facility] reporting worsening depression and anxiety for several months. Patient reports that she lost her job in April and states, "that was the glue". Patient reports after losing her job she lost motivation and stopped "doing". Patient reports that she is having a hard time taking care of her IADL's. Patient reports she sits at home, plays on her phone and watch television. Patient reports she is not cleaning or doing any activities that were enjoyable to her like reading, arts and crafts and gardening.    Patient reports that [facility] after losing her job the firs time. Patient reports after she finished the medications, she never got a refill. Patient reports she was also inpatient about 20 years ago after she attempted to overdose on her medications. Patient denies having outpatient services currently. Patient reports she has attempted suicide at least three times, two by OD on medications and once by  cutting wrist with glass when she was 60 years old. Patient reports family psychiatric history of her mother being diagnosed with depression. Patient reported symptoms of depression include, anhedonia, crying spells, isolating, feeling hopeless and poor sleep. Patient also reports drinking 0-2, 12oz cans of beer a day for the past six weeks. Patient reports after she lost her job in April, she went a couple of weeks drinking 10 cans of beer a day. Patient also reports smoking up to two packs of cigarettes a day. Patient states that she lives alone however she has a son who visits her for "a weekly hug" and she also has twin daughters that live in [place].   Patient is oriented x4, engaged, alert and cooperative during assessment. Patient eye contact and speech is normal. Patient is somewhat tearful during the assessment and overall pleasant. Patient asked if she was having thoughts of wanting to kill or harm self and patient responds, "I'm afraid that I might". Patient is without a plan however reports she has been researching how to kill self and state "I know that my blood pressure medicine can kill me". Patient able to report protective factor of her family and "wanting to go home" [place]. Patient denies HI, AVH and SIB. Patient is without delusional thoughts and does not appear manic or psychotic. Patient is not responding to internal/external stimuli.    Chief Complaint:  Chief Complaint  Patient presents with   Depression   Anxiety   Visit Diagnosis: MDD, severe, recurrent, without psychotic features.     CCA Screening, Triage and Referral (STR)  Patient Reported Information How did you hear about us? Family/Friend  What Is the Reason for Your Visit/Call Today? Came  to [facility] at the request of a friend reporting depression and anxiety. Denies SI, HI, AVH however has been researching how to commit suicide weeks ago. Hx of inpt treatment, no outpaitent treatment  How Long  Has This Been Causing You Problems? > than 6 months  What Do You Feel Would Help You the Most Today? Treatment for Depression or other mood problem   Have You Recently Had Any Thoughts About Hurting Yourself? No  Are You Planning to Commit Suicide/Harm Yourself At This time? No   Have you Recently Had Thoughts About Hurting Someone Karolee Ohs? No  Are You Planning to Harm Someone at This Time? No  Explanation: No data recorded  Have You Used Any Alcohol or Drugs in the Past 24 Hours? Yes  How Long Ago Did You Use Drugs or Alcohol? No data recorded What Did You Use and How Much? 1-2 beers daily   Do You Currently Have a Therapist/Psychiatrist? No data recorded Name of Therapist/Psychiatrist: No data recorded  Have You Been Recently Discharged From Any Office Practice or Programs? No data recorded Explanation of Discharge From Practice/Program: No data recorded    CCA Screening Triage Referral Assessment Type of Contact: No data recorded Telemedicine Service Delivery:   Is this Initial or Reassessment? No data recorded Date Telepsych consult ordered in [facility]:  No data recorded Time Telepsych consult ordered in [facility]:  No data recorded Location of Assessment: No data recorded Provider Location: No data recorded  Collateral Involvement: No data recorded  Does Patient Have a Court Appointed Legal Guardian? No data recorded Name and Contact of Legal Guardian: No data recorded If Minor and Not Living with Parent(s), Who has Custody? No data recorded Is CPS involved or ever been involved? No data recorded Is APS involved or ever been involved? No data recorded  Patient Determined To Be At Risk for Harm To Self or Others Based on Review of Patient Reported Information or Presenting Complaint? No data recorded Method: No data recorded Availability of Means: No data recorded Intent: No data recorded Notification Required: No data recorded Additional Information for Danger to Others  Potential: No data recorded Additional Comments for Danger to Others Potential: No data recorded Are There Guns or Other Weapons in Your Home? No data recorded Types of Guns/Weapons: No data recorded Are These Weapons Safely Secured?                            No data recorded Who Could Verify You Are Able To Have These Secured: No data recorded Do You Have any Outstanding Charges, Pending Court Dates, Parole/Probation? No data recorded Contacted To Inform of Risk of Harm To Self or Others: No data recorded   Does Patient Present under Involuntary Commitment? No data recorded IVC Papers Initial File Date: No data recorded  [place] of Residence: No data recorded  Patient Currently Receiving the Following Services: No data recorded  Determination of Need: Urgent (48 hours)   Options For Referral: Outpatient Therapy; Medication Management     CCA Biopsychosocial Patient Reported Schizophrenia/Schizoaffective Diagnosis in Past: No   Strengths: Wants to live and motivated for treatment   Mental Health Symptoms Depression:   Change in energy/activity; Hopelessness; Irritability; Sleep (too much or little); Tearfulness; Weight gain/loss   Duration of Depressive symptoms:  Duration of Depressive Symptoms: Greater than two weeks   Mania:   None   Anxiety:    Worrying; Tension   Psychosis:  None   Duration of Psychotic symptoms:    Trauma:   None   Obsessions:   None   Compulsions:   None   Inattention:   None   Hyperactivity/Impulsivity:   None   Oppositional/Defiant Behaviors:   None   Emotional Irregularity:   None   Other Mood/Personality Symptoms:  No data recorded   Mental Status Exam Appearance and self-care  Stature:   Small   Weight:   Thin   Clothing:   Age-appropriate   Grooming:   Neglected   Cosmetic use:   None   Posture/gait:   Normal   Motor activity:   Not Remarkable   Sensorium  Attention:   Normal    Concentration:   Normal   Orientation:   Person; Place; Situation; Time   Recall/memory:   Normal   Affect and Mood  Affect:   Tearful   Mood:   Depressed   Relating  Eye contact:   Normal   Facial expression:   Responsive; Sad; Depressed   Attitude toward examiner:   Cooperative   Thought and Language  Speech flow:  Clear and Coherent   Thought content:   Appropriate to Mood and Circumstances   Preoccupation:   None   Hallucinations:   None   Organization:  No data recorded  [employer] of Knowledge:   Good   Intelligence:   Average   Abstraction:   Normal   Judgement:   Fair   Reality Testing:   Adequate   Insight:   Fair   Decision Making:   Normal   Social Functioning  Social Maturity:   Isolates   Social Judgement:   Normal   Stress  Stressors:   Housing; Surveyor, quantity; Relationship; Grief/losses   Coping Ability:   Exhausted; Deficient supports   Skill Deficits:   None   Supports:   Family; Support needed     Religion:    Leisure/Recreation: Leisure / Recreation Do You Have Hobbies?: Yes Leisure and Hobbies: No motivation to read, garden or do arts and crafts  Exercise/Diet: Exercise/Diet Do You Exercise?: No Have You Gained or Lost A Significant Amount of Weight in the Past Six Months?: Yes-Lost Number of Pounds Lost?: 6 Do You Have Any Trouble Sleeping?: Yes   CCA Employment/Education Employment/Work Situation: Employment / Work Situation Employment Situation: Unemployed (lost job in April 2022, receiving unemployment benifits) Patient's Job has Been Impacted by Current Illness: No  Education: Education Is Patient Currently Attending School?: No   CCA Family/Childhood History Family and Relationship History: Family history Marital status: Divorced Divorced, when?: 20 yrs ago What types of issues is patient dealing with in the relationship?: unknown Additional relationship  information: none Does patient have children?: Yes How many children?: 3 How is patient's relationship with their children?: one son in [place] she sees weekly. Twin daughters in [place], poor relationship  Childhood History:     Child/Adolescent Assessment:     CCA Substance Use Alcohol/Drug Use: Alcohol / Drug Use Pain Medications: See MAR Prescriptions: See MAR Over the Counter: See MAR History of alcohol / drug use?: Yes Substance #1 Name of Substance 1: ETOH 1 - Amount (size/oz): 0-2-12 oz beer 1 - Frequency: daily 1 - Duration: ongoing                       ASAM's:  Six Dimensions of Multidimensional Assessment  Dimension 1:  Acute Intoxication and/or Withdrawal Potential:  Dimension 2:  Biomedical Conditions and Complications:      Dimension 3:  Emotional, Behavioral, or Cognitive Conditions and Complications:     Dimension 4:  Readiness to Change:     Dimension 5:  Relapse, Continued use, or Continued Problem Potential:     Dimension 6:  Recovery/Living Environment:     ASAM Severity Score:    ASAM Recommended Level of Treatment: ASAM Recommended Level of Treatment: Level I Outpatient Treatment   Substance use Disorder (SUD)    Recommendations for Services/Supports/Treatments: Recommendations for Services/Supports/Treatments Recommendations For Services/Supports/Treatments: Individual Therapy  Discharge Disposition:    DSM5 Diagnoses: Patient Active Problem List   Diagnosis Date Noted   Hyponatremia 07/01/2015   Diarrhea 07/01/2015   Essential hypertension 06/23/2015   Alcohol abuse 06/23/2015   History of depression 06/23/2015     Referrals to Alternative Service(s): Referred to Alternative Service(s):   Place:   Date:   Time:    Referred to Alternative Service(s):   Place:   Date:   Time:    Referred to Alternative Service(s):   Place:   Date:   Time:    Referred to Alternative Service(s):   Place:   Date:   Time:     Audree Camel, [facility]

## 2021-06-23 NOTE — ED Provider Notes (Signed)
Behavioral Health Urgent Care Medical Screening Exam  Patient Name: Alisha Franco MRN: [id number] Date of Evaluation: 06/23/21 Chief Complaint:  Depression Diagnosis:  Final diagnoses:  None    History of Present illness: Alisha Franco is a 60 y.o. female w/ hx of prior suicide attempts, recent loss of employment, and alcohol use disorder-severe presents to [facility] wanting to "get my life together". Pt had lost her employment earlier this year and since then has felt her life has lacked meaning. Pt states that her employment was her "glue" and that being unemployed has led to a great deal of stress and greatly disrupted pt's day-to-day living. Pt states she is unsure why she even lost her job and states that nobody actually knew why she was losing her job.  Pt states she has had minimal activity since loss of employment and loss of interest in regular hobbies. Pt states she has felt depressed and has anxiety leaving the house. Pt feels there is no point in leaving the house. Pt has had decrease in appetite, decrease in sleep, and reduced energy. Pt had been drinking 10 cans of beers a day and 2 ppd of tobacco for a few weeks after she lost her job. She has recently been able to limit herself to 0-2 cans of beers a day and 1 ppd of tobacco with plans to further decrease tobacco use.  Pt denies present SI/HI/AVH but has in past few weeks done research about committing suicide and has plan to overdose on hypertension medication as a "last resort". Pt is confident she will not attempt to harm herself at this time.  Pt agreeable for this writer to contact pt's son Ines Bloomer for collateral but unable to be reached at this time.  Psychiatric Specialty Exam  Presentation  General Appearance:Casual  Eye Contact:Good  Speech:Clear and Coherent  Speech Volume:Normal  Handedness:No data recorded  Mood and Affect  Mood:Hopeless; Depressed  Affect:Tearful   Thought Process  Thought  Processes:Coherent  Descriptions of Associations:Intact  Orientation:Full (Time, Place and Person)  Thought Content:Logical  Diagnosis of Schizophrenia or Schizoaffective disorder in past: No   Hallucinations:None  Ideas of Reference:None  Suicidal Thoughts:Yes, Passive With Plan  Homicidal Thoughts:No   Sensorium  Memory:Immediate Good; Recent Good; Remote Good  Judgment:Intact  Insight:Fair   Executive Functions  Concentration:Good  Attention Span:Fair  Recall:Fair  Fund of Knowledge:Fair  Language:Fair   Psychomotor Activity  Psychomotor Activity:Increased   Assets  Assets:Housing; Desire for Improvement; Social Support   Sleep  Sleep:Poor  Number of hours: 5   No data recorded  Physical Exam: Physical Exam ROS Blood pressure (!) 180/92, pulse (!) 130, temperature 98.6 F (37 C), temperature source Oral, resp. rate 16, SpO2 100 %. There is no height or weight on file to calculate BMI.  Musculoskeletal: Strength & Muscle Tone: within normal limits Gait & Station: normal Patient leans: N/A   [facility] MSE Discharge Disposition for Follow up and Recommendations: Based on my evaluation the patient does not appear to have an emergency medical condition and can be discharged with resources and follow up care in outpatient services for Medication Management and Individual Therapy  Information provided for [facility] Outpatient Psychiatry and Therapy. Patient qualifies as she is presently uninsured and [facility] resident.   Park Pope, MD 06/23/2021, 1:55 PM

## 2021-06-23 NOTE — ED Notes (Signed)
Discharge instructions provided and Pt stated understanding. Personal belongings returned from the orange locker. Pt escorted to the front lobby. Pt alert, orient and ambulatory. Safety maintained.

## 2021-06-23 NOTE — Discharge Instructions (Addendum)
Based on your visit today, you are eligible to Behavioral Health Services at [facility].  Please come to [facility] (this facility) during walk in hours for appointment with psychiatrist for further medication management and for therapists for therapy.    Walk in hours are 8-11 AM Monday through Thursday for medication management.Child and adolescent psychiatrists are only available on Wednesdays and Thursdays during walk in hours.  Therapy walk in hours are Monday-Wednesday 8 AM-1PM.   It is first come, first -serve; it is best to arrive by 7:00 AM.   On Friday from 1 pm to 4 pm for therapy intake only. Please arrive by 12:00 pm as it is  first come, first -serve.    When you arrive please go upstairs for your appointment. If you are unsure of where to go, inform the front desk that you are here for a walk in appointment and they will assist you with directions upstairs.  Address:  [address] in [place], [address] Ph: [phone]

## 2021-06-23 NOTE — BH Assessment (Signed)
Pt came to [facility] at the request of a friend reporting depression and anxiety. Denies SI, HI, AVH however has been researching how to commit suicide weeks ago. Hx of inpt treatment, no outpaitent treatment. Lost job in April. Reports not taking care of ADL's, difficulty eating, anhedonia. Supportive son that lives in [place] also has two daughter in [place] area.   Pt is urgent

## 2021-06-25 ENCOUNTER — Other Ambulatory Visit: Payer: Self-pay

## 2021-06-25 ENCOUNTER — Ambulatory Visit (INDEPENDENT_AMBULATORY_CARE_PROVIDER_SITE_OTHER): Payer: No Payment, Other | Admitting: Physician Assistant

## 2021-06-25 VITALS — BP 162/98 | HR 121 | Ht 66.0 in

## 2021-06-25 DIAGNOSIS — G4700 Insomnia, unspecified: Secondary | ICD-10-CM | POA: Insufficient documentation

## 2021-06-25 DIAGNOSIS — F99 Mental disorder, not otherwise specified: Secondary | ICD-10-CM | POA: Diagnosis not present

## 2021-06-25 DIAGNOSIS — F331 Major depressive disorder, recurrent, moderate: Secondary | ICD-10-CM

## 2021-06-25 DIAGNOSIS — F411 Generalized anxiety disorder: Secondary | ICD-10-CM | POA: Diagnosis not present

## 2021-06-25 DIAGNOSIS — F5105 Insomnia due to other mental disorder: Secondary | ICD-10-CM | POA: Diagnosis not present

## 2021-06-25 MED ORDER — ESCITALOPRAM OXALATE 20 MG PO TABS
10.0000 mg | ORAL_TABLET | Freq: Every day | ORAL | 1 refills | Status: DC
  Filled 2021-06-25: qty 30, 33d supply, fill #0
  Filled 2021-07-31: qty 30, 33d supply, fill #1

## 2021-06-25 MED ORDER — HYDROXYZINE HCL 25 MG PO TABS
25.0000 mg | ORAL_TABLET | Freq: Three times a day (TID) | ORAL | 1 refills | Status: DC | PRN
  Filled 2021-06-25: qty 90, 30d supply, fill #0

## 2021-06-25 MED ORDER — QUETIAPINE FUMARATE 50 MG PO TABS
50.0000 mg | ORAL_TABLET | Freq: Every day | ORAL | 1 refills | Status: DC
  Filled 2021-06-25: qty 30, 30d supply, fill #0
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
## 2021-06-28 ENCOUNTER — Encounter (HOSPITAL_COMMUNITY): Payer: Self-pay | Admitting: Physician Assistant

## 2021-06-29 ENCOUNTER — Other Ambulatory Visit: Payer: Self-pay

## 2021-06-29 ENCOUNTER — Other Ambulatory Visit (HOSPITAL_COMMUNITY): Payer: Self-pay | Admitting: Physician Assistant

## 2021-06-29 ENCOUNTER — Telehealth (HOSPITAL_COMMUNITY): Payer: Self-pay | Admitting: *Deleted

## 2021-06-29 DIAGNOSIS — F331 Major depressive disorder, recurrent, moderate: Secondary | ICD-10-CM

## 2021-06-29 DIAGNOSIS — G4700 Insomnia, unspecified: Secondary | ICD-10-CM

## 2021-06-29 DIAGNOSIS — M5430 Sciatica, unspecified side: Secondary | ICD-10-CM | POA: Insufficient documentation

## 2021-06-29 DIAGNOSIS — F411 Generalized anxiety disorder: Secondary | ICD-10-CM

## 2021-06-29 MED ORDER — GABAPENTIN 300 MG PO CAPS
300.0000 mg | ORAL_CAPSULE | Freq: Two times a day (BID) | ORAL | 1 refills | Status: DC
  Filled 2021-06-29: qty 60, 30d supply, fill #0

## 2021-06-29 MED ORDER — MIRTAZAPINE 7.5 MG PO TABS
7.5000 mg | ORAL_TABLET | Freq: Every day | ORAL | 1 refills | Status: DC
  Filled 2021-06-29: qty 30, 30d supply, fill #0

## 2021-06-29 NOTE — Telephone Encounter (Signed)
Provider was contacted Direce Netty Starring, RMA regarding patient's message.  Provider was able to contact patient.  Patient informed provider that Seroquel makes her feel entirely drowsy when waking up and she is unable to function at the level she needs.  Provider to discontinue Seroquel 50 mg at bedtime.  Provider recommended mirtazapine 7.5 mg at bedtime for the management of her depressive symptoms, anxiety, and insomnia.  Patient also has concerns over nerve pain due to sciatica.  Patient was recommended gabapentin for the management of her anxiety as well as an off label management of her nerve pain.

## 2021-06-29 NOTE — Progress Notes (Signed)
Provider was contacted Direce E. McIntyre, RMA regarding patient's message.  Provider was able to contact patient.  Patient informed provider that Seroquel makes her feel entirely drowsy when waking up and she is unable to function at the level she needs.  Provider to discontinue Seroquel 50 mg at bedtime.  Provider recommended mirtazapine 7.5 mg at bedtime for the management of her depressive symptoms, anxiety, and insomnia.  Patient also has concerns over nerve pain due to sciatica.  Patient was recommended gabapentin for the management of her anxiety as well as an off label management of her nerve pain.

## 2021-06-29 NOTE — Telephone Encounter (Signed)
PATIENT CALLED STATED THAT SHE CAN'T TAKE THE MEDICINE. WHEN INQUIRED MORE ARE TAKING ABOUT QUEtiapine (SEROQUEL) 50 MG tablet. REPLIED YES SHE NEEDS SOMETHING ELSE.

## 2021-07-03 ENCOUNTER — Telehealth (HOSPITAL_COMMUNITY): Payer: Self-pay | Admitting: *Deleted

## 2021-07-03 NOTE — Telephone Encounter (Signed)
Opened a second time in error. 

## 2021-07-06 ENCOUNTER — Telehealth (HOSPITAL_COMMUNITY): Payer: Self-pay | Admitting: Physician Assistant

## 2021-07-06 NOTE — Telephone Encounter (Signed)
Provider called patient who notes that she passed out after shopping.  She notes that she was able to place her groceries in her car however when she returned her shopping cart she got faint and passed out.  She notes that she lost consciousness however was unaware how long she lost consciousness.  She informed Clinical research associate that she also hit her head.  Provider asked patient if she had discontinued mirtazapine.  She notes that she did discontinue mirtazapine, hydroxyzine, and Seroquel.  She notes that she is only currently taking Lexapro.  Writer asked patient if she continues to take her blood pressure medication.  Acacian.  She notes that she is not able to take it at this time and notes that she cannot afford to be seen by a PCP.  Provider referred patient to community health and wellness.  Provider also encouraged patient to stay hydrated.  She notes that she has been hydrated.  Provider informed patient that if dizziness persists to follow-up with community health and wellness or her psychiatric provider.  She endorsed understanding and agreed.

## 2021-07-06 NOTE — Telephone Encounter (Signed)
Patient called requesting to speak with provider in regards to passing out at the store last weekend after starting new medication Remeron.  Informed pt that her provider is out of the office this week but will forward to nurse for f/u with provider available today.

## 2021-07-13 NOTE — Telephone Encounter (Signed)
Patient called requesting return phone call from provider U. Nwoko PAC upon his return to office. Pt says it is to talk to him regarding episode that occurred 07/04/21.

## 2021-07-16 ENCOUNTER — Other Ambulatory Visit (HOSPITAL_COMMUNITY): Payer: Self-pay | Admitting: Physician Assistant

## 2021-07-16 ENCOUNTER — Telehealth (HOSPITAL_COMMUNITY): Payer: Self-pay | Admitting: Physician Assistant

## 2021-07-16 DIAGNOSIS — G479 Sleep disorder, unspecified: Secondary | ICD-10-CM

## 2021-07-16 MED ORDER — ZOLPIDEM TARTRATE 5 MG PO TABS: 5.0000 mg | ORAL_TABLET | Freq: Every evening | ORAL | 0 refills | Status: DC | PRN

## 2021-07-16 NOTE — Telephone Encounter (Signed)
Patient asking for Ambien. Pt feels mirtazapine is what made her pass out.  Please advise. Pt uses [facility]

## 2021-07-16 NOTE — Progress Notes (Signed)
Provider contacted patient to discuss medication options. Patient has discontinued taking mirtazapine due to adverse side effect experienced from taking medication. Patient has also discontinued taking Seroquel and Hydroxyzine. Patient expresses that she has been still issues with her sleep. Patient has a past history of being on Zolpidem for sleep management. Provider to place patient on Zolpidem 5 mg at bedtime as needed for sleep management.

## 2021-07-19 NOTE — Telephone Encounter (Signed)
Provider was able to contact patient. She reports that she has discontinued take all her medications except for Lexapro. Patient reports that she discontinued taking her medications due to experiencing lightheadedness and fainting spells. Patient still struggles with sleep. Patient has a past history of taking zolpidem for the management of her sleep. Patient to be prescribed zolpidem 5 mg as needed for the management of her sleep disturbances. Patient's next follow up appointment is scheduled for 08/18/2021.

## 2021-07-19 NOTE — Telephone Encounter (Signed)
Provider acknowledge and reviewed message. Provider was able to contact patient to discuss medication regimen.

## 2021-07-27 ENCOUNTER — Ambulatory Visit: Payer: Self-pay | Admitting: Nurse Practitioner

## 2021-07-28 ENCOUNTER — Ambulatory Visit (INDEPENDENT_AMBULATORY_CARE_PROVIDER_SITE_OTHER): Payer: No Payment, Other | Admitting: Nurse Practitioner

## 2021-07-28 ENCOUNTER — Encounter: Payer: Self-pay | Admitting: Nurse Practitioner

## 2021-07-28 ENCOUNTER — Other Ambulatory Visit: Payer: Self-pay

## 2021-07-28 VITALS — BP 177/92 | HR 112 | Temp 97.4°F | Ht 66.0 in | Wt 107.0 lb

## 2021-07-28 DIAGNOSIS — Z131 Encounter for screening for diabetes mellitus: Secondary | ICD-10-CM

## 2021-07-28 DIAGNOSIS — R55 Syncope and collapse: Secondary | ICD-10-CM

## 2021-07-28 DIAGNOSIS — F411 Generalized anxiety disorder: Secondary | ICD-10-CM | POA: Diagnosis not present

## 2021-07-28 DIAGNOSIS — R634 Abnormal weight loss: Secondary | ICD-10-CM

## 2021-07-28 DIAGNOSIS — F331 Major depressive disorder, recurrent, moderate: Secondary | ICD-10-CM

## 2021-07-28 DIAGNOSIS — I10 Essential (primary) hypertension: Secondary | ICD-10-CM

## 2021-07-28 LAB — POCT GLYCOSYLATED HEMOGLOBIN (HGB A1C): Hemoglobin A1C: 4.9 % (ref 4.0–5.6)

## 2021-07-28 LAB — GLUCOSE, POCT (MANUAL RESULT ENTRY): POC Glucose: 92 mg/dl (ref 70–99)

## 2021-07-28 NOTE — Patient Instructions (Signed)
You were seen today in the [facility] for syncope and hypertension. Labs were collected, results will be available via MyChart or, if abnormal, you will be contacted by clinic staff. You were prescribed medications, please take as directed. Please follow up in 1 mth for reevaluation.

## 2021-07-28 NOTE — Progress Notes (Signed)
[facility] Patient [facility] [address] [place], [place]  [address] Phone:  [phone]   Fax:  [phone] Subjective:   Patient ID: Alisha Franco, female    DOB: 25-Mar-1961, 60 y.o.   MRN: [id number]  Chief Complaint  Patient presents with   Establish Care    Passed out on 8/20, swelling ankles.    HPI [name] 60 y.o. female presents with history of anxiety, depression and hypertension to the [facility] to establish care. Patient states that she experienced syncope with collapse three times on 8/20. States that while at the grocery store, she began feeling lightheaded and passed out while attempting to return cart. She sustained head injury and experienced fecal incontinence, during this event. States that she was evaluated by EMS, but limited their screening and refused transport to the ED due to lack of insurance and financial constraints.Suspects symptoms related to prescribed Remeron, which she has since stopped. Continues to have periods of lightheadedness.  Edema: Patient complains of edema. The location of the edema is ankle(s) bilateral.  The edema has been localized and intermittent.  Onset of symptoms was 1 month ago, gradually worsening since that time. The edema is present all day. The patient states never.  The swelling has been aggravated by nothing, relieved by elevation of involved area, and been associated with nothing. Cardiac risk factors include family history of premature cardiovascular disease, hypertension, and smoking/ tobacco exposure. Has never been evaluated for swelling. Began after syncopal episode last month.  When questioned about elevated B/P, patient states that she stopped taking B/P several months ago. Endorses having white coat syndrome and having coffee prior to arrival. States that she has been self treating for low blood pressure at home. B/P at home as been as low as 90/60 and as high as 136/70. Stopped taking medications due to financial  constraints and not having insurance.  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 Since that time has struggled with anxiety, depression and insomnia. At times, she has gone 36 hrs without sleep. Symptoms currently being managed by [facility] provider.States that she has been compliant with prescribed medications from [facility]. Also had some difficulty with weight loss and appetite since losing job. This is currently improving. Eats several small meals throughout the day and has had some weight gain.   Endorses having intermittent periods of right chest pain since being treated for bronchitis 1-2  mths ago. She was prescribed and completed course of z pak, but continues to have intermittent cough. Suspects her bronchitis ha snot completely resolved, in addition to being a chronic smoker. Also endorses having chest pain with exertion. Denies any symptoms during today's visit.    Past Medical History:  Diagnosis Date   Acute bronchitis, bacterial    Hypertension     Past Surgical History:  Procedure Laterality Date   btl     depression     TUBAL LIGATION      Family History  Problem Relation Age of Onset   Stroke Mother    Stroke Father     Social History   Socioeconomic History   Marital status: Legally Separated    Spouse name: Not on file   Number of children: Not on file   Years of education: Not on file   Highest education level: Not on file  Occupational History   Not on file  Tobacco Use   Smoking status: Every Day    Packs/day: 1.00  Years: 40.00    Pack years: 40.00    Types: Cigarettes   Smokeless tobacco: Never  Substance and Sexual Activity   Alcohol use: Yes   Drug use: No   Sexual activity: Not Currently  Other Topics Concern   Not on file  Social History Narrative   Not on file   Social Determinants of Health   Financial Resource Strain: Not on file  Food Insecurity: Not on file  Transportation Needs: Not on file  Physical  Activity: Not on file  Stress: Not on file  Social Connections: Not on file  Intimate Partner Violence: Not on file    Outpatient Medications Prior to Visit  Medication Sig Dispense Refill   escitalopram (LEXAPRO) 20 MG tablet Take half tablet (10 mg total) for 6 days, followed by full tablet (20 mg total) daily after. 30 tablet 1   zolpidem (AMBIEN) 5 MG tablet Take 1 tablet (5 mg total) by mouth at bedtime as needed for sleep. 30 tablet 0   albuterol (VENTOLIN HFA) 108 (90 Base) MCG/ACT inhaler Inhale 2 puffs into the lungs every 4 (four) hours as needed.     gabapentin (NEURONTIN) 300 MG capsule Take 1 capsule (300 mg total) by mouth 2 (two) times daily. (Patient not taking: Reported on 07/28/2021) 60 capsule 1   hydrOXYzine (ATARAX/VISTARIL) 25 MG tablet Take 1 tablet (25 mg total) by mouth 3 (three) times daily as needed. (Patient not taking: Reported on 07/28/2021) 90 tablet 1   QUEtiapine (SEROQUEL) 50 MG tablet Take 1 tablet (50 mg total) by mouth at bedtime. (Patient not taking: Reported on 07/28/2021) 30 tablet 1   amLODipine (NORVASC) 5 MG tablet Take 1 tablet (5 mg total) by mouth daily. 90 tablet 3   esomeprazole (NEXIUM) 40 MG capsule Take 40 mg by mouth daily at 12 noon. (Patient not taking: Reported on 07/28/2021)     mirtazapine (REMERON) 7.5 MG tablet Take 1 tablet (7.5 mg total) by mouth at bedtime. (Patient not taking: Reported on 07/28/2021) 30 tablet 1   No facility-administered medications prior to visit.    No Known Allergies  Review of Systems  Constitutional:  Positive for weight loss. Negative for chills, fever and malaise/fatigue.  HENT: Negative.    Eyes: Negative.   Respiratory:  Positive for shortness of breath. Negative for cough.   Cardiovascular:  Positive for chest pain. Negative for palpitations and leg swelling.  Gastrointestinal:  Negative for abdominal pain, blood in stool, constipation, diarrhea, nausea and vomiting.  Genitourinary: Negative.    Musculoskeletal: Negative.   Skin: Negative.   Neurological:  Positive for dizziness. Negative for tingling, tremors, sensory change, speech change, focal weakness, seizures, loss of consciousness, weakness and headaches.  Psychiatric/Behavioral:  Positive for depression. The patient is nervous/anxious and has insomnia.   All other systems reviewed and are negative.     Objective:    Physical Exam Vitals reviewed.  Constitutional:      General: She is not in acute distress.    Appearance: Normal appearance.  HENT:     Head: Normocephalic.  Cardiovascular:     Rate and Rhythm: Normal rate and regular rhythm.     Pulses: Normal pulses.     Heart sounds: Normal heart sounds.     Comments: +2 pitting edema in the left ankle and +1 pitting edema in the right ankle Pulmonary:     Effort: Pulmonary effort is normal.     Breath sounds: Normal breath sounds.  Skin:  General: Skin is warm and dry.     Capillary Refill: Capillary refill takes less than 2 seconds.  Neurological:     Mental Status: She is alert.  Psychiatric:        Mood and Affect: Mood normal.        Behavior: Behavior normal.        Thought Content: Thought content normal.        Judgment: Judgment normal.    BP (!) 177/92   Pulse (!) 112   Temp (!) 97.4 F (36.3 C)   Ht 5\' 6"  (1.676 m)   Wt 107 lb 0.6 oz (48.6 kg)   SpO2 98%   BMI 17.28 kg/m  Wt Readings from Last 3 Encounters:  07/28/21 107 lb 0.6 oz (48.6 kg)  07/29/15 113 lb (51.3 kg)  07/14/15 113 lb 6.4 oz (51.4 kg)    Immunization History  Administered Date(s) Administered   Unspecified SARS-COV-2 Vaccination 03/19/2020    Diabetic Foot Exam - Simple   No data filed     Lab Results  Component Value Date   TSH 1.422 07/01/2015   Lab Results  Component Value Date   WBC 12.7 (H) 07/14/2015   HGB 11.9 (L) 07/14/2015   HCT 32.7 (L) 07/14/2015   MCV 89.8 07/14/2015   PLT 424 (H) 07/14/2015   Lab Results  Component Value Date   NA  134 (L) 07/18/2015   K 4.0 07/18/2015   CO2 23 07/18/2015   GLUCOSE 126 (H) 07/18/2015   BUN 7 07/18/2015   CREATININE 0.70 07/18/2015   BILITOT 0.4 07/14/2015   ALKPHOS 70 07/14/2015   AST 17 07/14/2015   ALT 22 07/14/2015   PROT 7.1 07/14/2015   ALBUMIN 4.2 07/14/2015   CALCIUM 9.3 07/18/2015   ANIONGAP 8 07/03/2015   Lab Results  Component Value Date   CHOL 262 (H) 06/23/2015   Lab Results  Component Value Date   HDL 93 06/23/2015   Lab Results  Component Value Date   LDLCALC 149 (H) 06/23/2015   Lab Results  Component Value Date   TRIG 100 06/23/2015   Lab Results  Component Value Date   CHOLHDL 2.8 06/23/2015   Lab Results  Component Value Date   HGBA1C 4.9 07/28/2021       Assessment & Plan:   Problem List Items Addressed This Visit       Cardiovascular and Mediastinum   Essential hypertension - Primary   Relevant Orders   Pro b natriuretic peptide (BNP) See Plan/MDM below for syncopal episodes      Other   Moderate episode of recurrent major depressive disorder (HCC)   Generalized anxiety disorder Encouraged continued evaluation and management by [facility]   Other Visit Diagnoses     Screening for diabetes mellitus       Relevant Orders   HgB A1c (Completed)   Glucose (CBG) (Completed)   Syncope and collapse       Relevant Orders   CBC with Differential/Platelet   Comprehensive metabolic panel   Lipid panel   Pro b natriuretic peptide (BNP)  Given patient's history and physical, wide differential, including cardiomyopathy v CHF v arrhythmia v other cardiac etiology v other neurological etiology  Discussed at length the need for additional testing, such as echo and ekg, with possible referral to cardiology. Patient refused, frequently demonstrating concern for cost. Offered referral for financial counseling and/ information about Orange/Blue card, patient refused, stating, " I just want to get better  so [place] can [place]. I really only want the labs."   Weight loss, non-intentional      Encouraged continuation of small frequent meals Recommended follow up for weight management, patient refused   Follow up in 1 mth for reevaluation    I have discontinued Zyasia Badal's esomeprazole, amLODipine, and mirtazapine. I am also having her maintain her hydrOXYzine, QUEtiapine, escitalopram, gabapentin, zolpidem, and albuterol.  No orders of the defined types were placed in this encounter.    Kathrynn Speed, NP

## 2021-07-29 LAB — COMPREHENSIVE METABOLIC PANEL
ALT: 14 IU/L (ref 0–32)
AST: 14 IU/L (ref 0–40)
Albumin/Globulin Ratio: 1 — ABNORMAL LOW (ref 1.2–2.2)
Albumin: 4 g/dL (ref 3.8–4.9)
Alkaline Phosphatase: 116 IU/L (ref 44–121)
BUN/Creatinine Ratio: 16 (ref 12–28)
BUN: 12 mg/dL (ref 8–27)
Bilirubin Total: 0.2 mg/dL (ref 0.0–1.2)
CO2: 23 mmol/L (ref 20–29)
Calcium: 9.8 mg/dL (ref 8.7–10.3)
Chloride: 91 mmol/L — ABNORMAL LOW (ref 96–106)
Creatinine, Ser: 0.73 mg/dL (ref 0.57–1.00)
Globulin, Total: 3.9 g/dL (ref 1.5–4.5)
Glucose: 79 mg/dL (ref 65–99)
Potassium: 4.7 mmol/L (ref 3.5–5.2)
Sodium: 130 mmol/L — ABNORMAL LOW (ref 134–144)
Total Protein: 7.9 g/dL (ref 6.0–8.5)
eGFR: 94 mL/min/{1.73_m2} (ref >59)

## 2021-07-29 LAB — CBC WITH DIFFERENTIAL/PLATELET
Basophils Absolute: 0.1 10*3/uL (ref 0.0–0.2)
Basos: 1 %
EOS (ABSOLUTE): 0.1 10*3/uL (ref 0.0–0.4)
Eos: 1 %
Hematocrit: 29.8 % — ABNORMAL LOW (ref 34.0–46.6)
Hemoglobin: 10.3 g/dL — ABNORMAL LOW (ref 11.1–15.9)
Immature Grans (Abs): 0 10*3/uL (ref 0.0–0.1)
Immature Granulocytes: 0 %
Lymphocytes Absolute: 2.5 10*3/uL (ref 0.7–3.1)
Lymphs: 25 %
MCH: 30.5 pg (ref 26.6–33.0)
MCHC: 34.6 g/dL (ref 31.5–35.7)
MCV: 88 fL (ref 79–97)
Monocytes Absolute: 0.8 10*3/uL (ref 0.1–0.9)
Monocytes: 8 %
Neutrophils Absolute: 6.4 10*3/uL (ref 1.4–7.0)
Neutrophils: 65 %
Platelets: 567 10*3/uL — ABNORMAL HIGH (ref 150–450)
RBC: 3.38 x10E6/uL — ABNORMAL LOW (ref 3.77–5.28)
RDW: 12.3 % (ref 11.7–15.4)
WBC: 9.9 10*3/uL (ref 3.4–10.8)

## 2021-07-29 LAB — LIPID PANEL
Chol/HDL Ratio: 3.8 ratio (ref 0.0–4.4)
Cholesterol, Total: 264 mg/dL — ABNORMAL HIGH (ref 100–199)
HDL: 69 mg/dL (ref >39)
LDL Chol Calc (NIH): 181 mg/dL — ABNORMAL HIGH (ref 0–99)
Triglycerides: 82 mg/dL (ref 0–149)
VLDL Cholesterol Cal: 14 mg/dL (ref 5–40)

## 2021-07-29 LAB — PRO B NATRIURETIC PEPTIDE: NT-Pro BNP: 900 pg/mL — ABNORMAL HIGH (ref 0–287)

## 2021-07-30 ENCOUNTER — Telehealth: Payer: Self-pay

## 2021-07-30 ENCOUNTER — Other Ambulatory Visit: Payer: Self-pay | Admitting: Nurse Practitioner

## 2021-07-30 ENCOUNTER — Ambulatory Visit (HOSPITAL_COMMUNITY): Payer: Self-pay | Attending: Nurse Practitioner

## 2021-07-30 ENCOUNTER — Other Ambulatory Visit: Payer: Self-pay

## 2021-07-30 DIAGNOSIS — R55 Syncope and collapse: Secondary | ICD-10-CM | POA: Insufficient documentation

## 2021-07-30 LAB — ECHOCARDIOGRAM COMPLETE
Area-P 1/2: 8.92 cm2
S' Lateral: 2.3 cm

## 2021-07-30 NOTE — Telephone Encounter (Signed)
Per Orion Crook, NP patient notified of abnormal labs patient agreeded to have Echo scheueld. Will contact patient with location time/date. Verbally understood. No additional questions or concerns.

## 2021-07-30 NOTE — Telephone Encounter (Signed)
Patient scheduled for Echo 07/30/2021 3:45 PM at [facility] at [facility] [address] [place],  [place]  [address]  Patient notified.

## 2021-07-31 ENCOUNTER — Other Ambulatory Visit: Payer: Self-pay

## 2021-07-31 ENCOUNTER — Other Ambulatory Visit: Payer: Self-pay | Admitting: Nurse Practitioner

## 2021-07-31 DIAGNOSIS — R55 Syncope and collapse: Secondary | ICD-10-CM

## 2021-07-31 DIAGNOSIS — R609 Edema, unspecified: Secondary | ICD-10-CM

## 2021-07-31 MED ORDER — FUROSEMIDE 20 MG PO TABS
20.0000 mg | ORAL_TABLET | Freq: Two times a day (BID) | ORAL | 11 refills | Status: DC
  Filled 2021-07-31: qty 60, 30d supply, fill #0
  Filled 2021-09-17: qty 60, 30d supply, fill #1

## 2021-08-11 LAB — PRO B NATRIURETIC PEPTIDE

## 2021-08-18 ENCOUNTER — Ambulatory Visit (INDEPENDENT_AMBULATORY_CARE_PROVIDER_SITE_OTHER): Payer: No Payment, Other | Admitting: Physician Assistant

## 2021-08-18 ENCOUNTER — Other Ambulatory Visit: Payer: Self-pay

## 2021-08-18 ENCOUNTER — Encounter (HOSPITAL_COMMUNITY): Payer: Self-pay | Admitting: Physician Assistant

## 2021-08-18 VITALS — BP 192/104 | HR 99 | Ht 66.0 in | Wt 110.0 lb

## 2021-08-18 DIAGNOSIS — F99 Mental disorder, not otherwise specified: Secondary | ICD-10-CM

## 2021-08-18 DIAGNOSIS — F331 Major depressive disorder, recurrent, moderate: Secondary | ICD-10-CM

## 2021-08-18 DIAGNOSIS — F411 Generalized anxiety disorder: Secondary | ICD-10-CM

## 2021-08-18 DIAGNOSIS — F5105 Insomnia due to other mental disorder: Secondary | ICD-10-CM | POA: Diagnosis not present

## 2021-08-18 MED ORDER — HYDROXYZINE HCL 10 MG PO TABS
10.0000 mg | ORAL_TABLET | Freq: Three times a day (TID) | ORAL | 1 refills | Status: DC | PRN
  Filled 2021-08-18: qty 90, 30d supply, fill #0

## 2021-08-18 MED ORDER — HYDROXYZINE HCL 25 MG PO TABS
25.0000 mg | ORAL_TABLET | Freq: Three times a day (TID) | ORAL | 1 refills | Status: DC | PRN
  Filled 2021-08-18: qty 90, 30d supply, fill #0

## 2021-08-18 MED ORDER — ARIPIPRAZOLE 2 MG PO TABS
2.0000 mg | ORAL_TABLET | Freq: Every day | ORAL | 1 refills | Status: DC
  Filled 2021-08-18: qty 30, 30d supply, fill #0
  Filled 2021-09-23: qty 30, 30d supply, fill #1

## 2021-08-18 NOTE — Progress Notes (Signed)
BH MD/PA/NP OP Progress Note  08/18/2021 8:18 PM Alisha [place]  MRN:  [id number]  Chief Complaint:  Chief Complaint   Medication Management    HPI:   Alisha Franco is a 60 year old female with a past psychiatric history significant for generalized anxiety disorder, major depressive disorder, and insomnia who presents to [facility] for follow-up and medication management.  Patient is currently being managed on the following medications:  Zolpidem (Ambien) 5 mg at bedtime as needed Escitalopram 20 mg daily  Patient reports that she continues to be seen by her primary care provider due to her hypertension and lower extremity edema.  Patient endorses that her feet have been swelling really badly as of late and is currently being placed on Lasix.  Patient notes concerns over the amount of test being performed on her to evaluate her hypertension/condition of her heart.  Patient reports that her escitalopram makes her drowsy when taking.  She recounts an incident a few days ago where she was up until 3 in the morning, went to sleep, and woke up at 8 AM to take her medication.  When taking her escitalopram in the morning, she would back to bed and lay for roughly 3 more hours.  Patient also expresses that she is still dealing with symptoms related to her depression.  Patient states that she often finds herself killing time and would rather be productive.  Patient expresses that she still has many activities that need to be done around the house but she will often find herself playing solitaire.  Patient notes that she has been experiencing stress sweating.  This is a pre-existing issue for the patient and is unable to determine any discernible triggers to why she has been recently stressed waiting.  Patient states that she will find herself waking up in the morning with a wet head.  Patient states that when she is stressed with it smells really bad and that she  often experiences this phenomenon whenever talking with her sister.  Patient notes that talking to her sister can be stressful at times due to them being different in personality.  Whenever patient is bothered by some of the things her sister is saying, she will tell her sister to lay off.  She reports that her sister is often asking her questions about her passport and why it is taking her so long to obtain it.  Patient is alert and oriented x4, calm, cooperative, and fully engaged in conversation during the encounter.  Patient endorses good mood.  Patient denies suicidal or homicidal ideations.  She further denies auditory or visual hallucinations.  Patient endorses fair sleep and receives on average anywhere between 4 to 8 hours of sleep each night.  Patient states that she has only needed to take her Ambien twice since obtaining her prescription.  Patient expresses that she has been able to receive roughly 8 hours of sleep while on her Ambien.  Patient endorses good appetite and eats at least more than 1 meal per day.  Patient endorses occasional alcohol consumption.  Patient endorses tobacco use and states that she smokes roughly a pack and 1/2/day.  Patient endorses that she has been smoking more as of late.  Patient denies illicit drug use.  Visit Diagnosis:    ICD-10-CM   1. Generalized anxiety disorder  F41.1 hydrOXYzine (ATARAX/VISTARIL) 10 MG tablet    DISCONTINUED: hydrOXYzine (ATARAX/VISTARIL) 25 MG tablet    2. Moderate episode of recurrent major depressive disorder (  HCC)  F33.1 ARIPiprazole (ABILIFY) 2 MG tablet    3. Insomnia due to other mental disorder  F51.05    F99       Past Psychiatric History:  Major depressive disorder Generalized anxiety disorder Insomnia  Past Medical History:  Past Medical History:  Diagnosis Date   Acute bronchitis, bacterial    Hypertension     Past Surgical History:  Procedure Laterality Date   btl     depression     TUBAL LIGATION       Family Psychiatric History:  Mother - Depression, patient states that her mother's depression worsened after the patient's father had experienced 2 strokes with the second stroke killing him  Family History:  Family History  Problem Relation Age of Onset   Stroke Mother    Stroke Father     Social History:  Social History   Socioeconomic History   Marital status: Legally Separated    Spouse name: Not on file   Number of children: Not on file   Years of education: Not on file   Highest education level: Not on file  Occupational History   Not on file  Tobacco Use   Smoking status: Every Day    Packs/day: 1.00    Years: 40.00    Pack years: 40.00    Types: Cigarettes   Smokeless tobacco: Never  Substance and Sexual Activity   Alcohol use: Yes   Drug use: No   Sexual activity: Not Currently  Other Topics Concern   Not on file  Social History Narrative   Not on file   Social Determinants of Health   Financial Resource Strain: Not on file  Food Insecurity: Not on file  Transportation Needs: Not on file  Physical Activity: Not on file  Stress: Not on file  Social Connections: Not on file    Allergies: No Known Allergies  Metabolic Disorder Labs: Lab Results  Component Value Date   HGBA1C 4.9 07/28/2021   No results found for: PROLACTIN Lab Results  Component Value Date   CHOL 264 (H) 07/28/2021   TRIG 82 07/28/2021   HDL 69 07/28/2021   CHOLHDL 3.8 07/28/2021   VLDL 20 06/23/2015   LDLCALC 181 (H) 07/28/2021   LDLCALC 149 (H) 06/23/2015   Lab Results  Component Value Date   TSH 1.422 07/01/2015    Therapeutic Level Labs: No results found for: LITHIUM No results found for: VALPROATE No components found for:  CBMZ  Current Medications: Current Outpatient Medications  Medication Sig Dispense Refill   albuterol (VENTOLIN HFA) 108 (90 Base) MCG/ACT inhaler Inhale 2 puffs into the lungs every 4 (four) hours as needed.     ARIPiprazole (ABILIFY)  2 MG tablet Take 1 tablet (2 mg total) by mouth daily. 30 tablet 1   escitalopram (LEXAPRO) 20 MG tablet Take half tablet (10 mg total) for 6 days, followed by full tablet (20 mg total) daily after. 30 tablet 1   furosemide (LASIX) 20 MG tablet Take 1 tablet (20 mg total) by mouth 2 (two) times daily. 60 tablet 11   gabapentin (NEURONTIN) 300 MG capsule Take 1 capsule (300 mg total) by mouth 2 (two) times daily. 60 capsule 1   zolpidem (AMBIEN) 5 MG tablet Take 1 tablet (5 mg total) by mouth at bedtime as needed for sleep. 30 tablet 0   hydrOXYzine (ATARAX/VISTARIL) 10 MG tablet Take 1 tablet (10 mg total) by mouth 3 (three) times daily as needed. 90 tablet 1  No current facility-administered medications for this visit.     Musculoskeletal: Strength & Muscle Tone: within normal limits Gait & Station: normal Patient leans: N/A  Psychiatric Specialty Exam: Review of Systems  Psychiatric/Behavioral:  Positive for sleep disturbance. Negative for decreased concentration, dysphoric mood, hallucinations, self-injury and suicidal ideas. The patient is nervous/anxious. The patient is not hyperactive.    Blood pressure (!) 192/104, pulse 99, height 5\' 6"  (1.676 m), weight 110 lb (49.9 kg), SpO2 95 %.Body mass index is 17.75 kg/m.  General Appearance: Well Groomed  Eye Contact:  Good  Speech:  Clear and Coherent and Normal Rate  Volume:  Normal  Mood:  Depressed  Affect:  Congruent and Depressed  Thought Process:  Coherent, Goal Directed, and Descriptions of Associations: Intact  Orientation:  Full (Time, Place, and Person)  Thought Content: WDL   Suicidal Thoughts:  No  Homicidal Thoughts:  No  Memory:  Immediate;   Good Recent;   Good Remote;   Good  Judgement:  Good  Insight:  Good  Psychomotor Activity:  Normal  Concentration:  Concentration: Good and Attention Span: Good  Recall:  Good  Fund of Knowledge: Good  Language: Good  Akathisia:  NA  Handed:  Right  AIMS (if  indicated): not done  Assets:  Communication Skills Desire for Improvement Housing Social Support  ADL's:  Intact  Cognition: WNL  Sleep:  Poor   Screenings: GAD-7    Flowsheet Row Office Visit from 08/18/2021 in [facility] Office Visit from 06/25/2021 in [facility]  Total GAD-7 Score 11 17      PHQ2-9    Flowsheet Row Office Visit from 08/18/2021 in [facility] Office Visit from 06/25/2021 in [facility]  PHQ-2 Total Score 3 4  PHQ-9 Total Score 12 12      Flowsheet Row Office Visit from 08/18/2021 in [facility] Office Visit from 06/25/2021 in [facility] ED from 06/23/2021 in [facility]  C-SSRS RISK CATEGORY Low Risk Low Risk Moderate Risk        Assessment and Plan:   Alisha Franco is a 60 year old female with a past psychiatric history significant for generalized anxiety disorder, major depressive disorder, and insomnia who presents to [facility] for follow-up and medication management.  Patient states that she would like to be more productive during her day to day activities.  Both a PHQ-9 and GAD-7 screen were performed with the patient scoring a 12 and 11, respectively.  Patient was recommended being placed on Abilify 2 mg daily for the management of her depression.  Patient also requested to be placed back on hydroxyzine 10 mg 3 times daily as needed for the management of her anxiety.  Patient was agreeable to plan.  Patient's medications to be e-prescribed to pharmacy of choice.  1. Generalized anxiety disorder  - hydrOXYzine (ATARAX/VISTARIL) 10 MG tablet; Take 1 tablet (10 mg total) by mouth 3 (three) times daily as needed.  Dispense: 90 tablet; Refill: 1  2. Moderate episode of recurrent major depressive disorder (HCC)  -  ARIPiprazole (ABILIFY) 2 MG tablet; Take 1 tablet (2 mg total) by mouth daily.  Dispense: 30 tablet; Refill: 1  3. Insomnia due to other mental disorder Patient to continue taking Ambien 5 mg at bedtime as needed for the management of her sleep  Patient to follow up in 6  weeks Provider spent a total of 35 minutes with the patient/reviewing the patient's chart  Meta Hatchet, PA 08/18/2021, 8:18 PM

## 2021-08-19 ENCOUNTER — Other Ambulatory Visit: Payer: Self-pay

## 2021-08-20 ENCOUNTER — Other Ambulatory Visit: Payer: Self-pay

## 2021-08-28 ENCOUNTER — Other Ambulatory Visit: Payer: Self-pay

## 2021-08-28 ENCOUNTER — Ambulatory Visit (INDEPENDENT_AMBULATORY_CARE_PROVIDER_SITE_OTHER): Payer: No Payment, Other | Admitting: Nurse Practitioner

## 2021-08-28 ENCOUNTER — Ambulatory Visit: Payer: Self-pay | Admitting: Nurse Practitioner

## 2021-08-28 ENCOUNTER — Encounter: Payer: Self-pay | Admitting: Nurse Practitioner

## 2021-08-28 VITALS — BP 170/90 | HR 101 | Temp 97.4°F | Ht 66.0 in | Wt 114.2 lb

## 2021-08-28 DIAGNOSIS — D75839 Thrombocytosis, unspecified: Secondary | ICD-10-CM | POA: Diagnosis not present

## 2021-08-28 DIAGNOSIS — I10 Essential (primary) hypertension: Secondary | ICD-10-CM

## 2021-08-28 DIAGNOSIS — R609 Edema, unspecified: Secondary | ICD-10-CM

## 2021-08-28 MED ORDER — LISINOPRIL 20 MG PO TABS
20.0000 mg | ORAL_TABLET | Freq: Every day | ORAL | 0 refills | Status: DC
  Filled 2021-08-28 – 2021-09-17 (×2): qty 30, 30d supply, fill #0

## 2021-08-28 NOTE — Patient Instructions (Signed)
You were seen today in the [facility] for follow up of chronic illness. You were prescribed medications, please take as directed. Please follow up in 2 mths for reevaluation of hypertension.

## 2021-08-28 NOTE — Progress Notes (Signed)
[place], [place]  [address] Phone:  [phone]   Fax:  [phone] Subjective:   Patient ID: Alisha Franco, female    DOB: [birth year], 60 y.o.   MRN: [id number]  Chief Complaint  Patient presents with   Follow-up    Pt is here today for her follow up visit.  Pt states that she is concerned about her lab results. Pt blood pressure has been elevated since her last visit especially when she is stressed.   HPI Alisha Franco 60 y.o. female presents with history of hypertension to the [facility] for follow up of chronic illness and peripheral edema. Patient states that she has been compliant with prescribed medications with mild improvement in swelling and significant improvement in shortness of breath. Has also had some improvement in the amount of sleep she is getting at night.   Scheduled upcoming appointment with cardiology. When questioned about elevation in B/P, states that he B/P is often elevated due to anxiety during doctors visits. Has been placed on B/P medications in the past with no improvement in B/P.   Endorses some improvement in diet and exercise efforts. Continues to be unemployed at this time. Visits counselor regularly and states, " I feel really good today." Denies any other complaints today. Patient requesting review of lab results today, questioning increase in platelets.  Denies any fever. Denies any fatigue, chest pain, shortness of breath, HA or dizziness. Denies any blurred vision, numbness or tingling.    Past Medical History:  Diagnosis Date   Acute bronchitis, bacterial    Hypertension     Past Surgical History:  Procedure Laterality Date   btl     depression     TUBAL LIGATION      Family History  Problem Relation Age of Onset   Stroke Mother    Stroke Father     Social History   Socioeconomic History   Marital status: Legally Separated    Spouse name: Not on file   Number of children: Not on file    Years of education: Not on file   Highest education level: Not on file  Occupational History   Not on file  Tobacco Use   Smoking status: Every Day    Packs/day: 1.00    Years: 40.00    Pack years: 40.00    Types: Cigarettes   Smokeless tobacco: Never  Vaping Use   Vaping Use: Never used  Substance and Sexual Activity   Alcohol use: Yes   Drug use: No   Sexual activity: Not Currently  Other Topics Concern   Not on file  Social History Narrative   Not on file   Social Determinants of Health   Financial Resource Strain: Not on file  Food Insecurity: Not on file  Transportation Needs: Not on file  Physical Activity: Not on file  Stress: Not on file  Social Connections: Not on file  Intimate Partner Violence: Not on file    Outpatient Medications Prior to Visit  Medication Sig Dispense Refill   albuterol (VENTOLIN HFA) 108 (90 Base) MCG/ACT inhaler Inhale 2 puffs into the lungs every 4 (four) hours as needed.     escitalopram (LEXAPRO) 20 MG tablet Take half tablet (10 mg total) for 6 days, followed by full tablet (20 mg total) daily after. 30 tablet 1   furosemide (LASIX) 20 MG tablet Take 1 tablet (20 mg total) by mouth 2 (two) times daily. 60 tablet 11  hydrOXYzine (ATARAX/VISTARIL) 10 MG tablet Take 1 tablet (10 mg total) by mouth 3 (three) times daily as needed. 90 tablet 1   zolpidem (AMBIEN) 5 MG tablet Take 1 tablet (5 mg total) by mouth at bedtime as needed for sleep. 30 tablet 0   ARIPiprazole (ABILIFY) 2 MG tablet Take 1 tablet (2 mg total) by mouth daily. 30 tablet 1   gabapentin (NEURONTIN) 300 MG capsule Take 1 capsule (300 mg total) by mouth 2 (two) times daily. (Patient not taking: Reported on 08/28/2021) 60 capsule 1   No facility-administered medications prior to visit.    No Known Allergies  Review of Systems  Constitutional:  Negative for chills, fever and malaise/fatigue.  HENT: Negative.    Eyes: Negative.   Respiratory:  Negative for cough  and shortness of breath.   Cardiovascular:  Negative for chest pain, palpitations and leg swelling.  Gastrointestinal:  Negative for abdominal pain, blood in stool, constipation, diarrhea, nausea and vomiting.  Genitourinary: Negative.   Musculoskeletal: Negative.   Skin: Negative.   Neurological: Negative.   Psychiatric/Behavioral:  Negative for depression. The patient is not nervous/anxious.   All other systems reviewed and are negative.     Objective:    Physical Exam Vitals reviewed.  Constitutional:      General: She is not in acute distress.    Appearance: Normal appearance.  HENT:     Head: Normocephalic.     Mouth/Throat:     Mouth: Mucous membranes are moist.     Pharynx: Oropharynx is clear.  Cardiovascular:     Rate and Rhythm: Normal rate and regular rhythm.     Pulses: Normal pulses.     Heart sounds: Normal heart sounds.     Comments: Mild swelling noted in the bilateral ankles Pulmonary:     Effort: Pulmonary effort is normal.     Breath sounds: Normal breath sounds.  Skin:    General: Skin is warm and dry.     Capillary Refill: Capillary refill takes less than 2 seconds.  Neurological:     General: No focal deficit present.     Mental Status: She is alert and oriented to person, place, and time.  Psychiatric:        Mood and Affect: Mood normal.        Behavior: Behavior normal.        Thought Content: Thought content normal.        Judgment: Judgment normal.    BP (!) 170/90   Pulse (!) 101   Temp (!) 97.4 F (36.3 C)   Ht _0  (1.676 m)   Wt 114 lb 3.2 oz (51.8 kg)   SpO2 100%   BMI 18.43 kg/m  Wt Readings from Last 3 Encounters:  08/28/21 114 lb 3.2 oz (51.8 kg)  07/28/21 107 lb 0.6 oz (48.6 kg)  07/29/15 113 lb (51.3 kg)    Immunization History  Administered Date(s) Administered   Unspecified SARS-COV-2 Vaccination 03/19/2020    Diabetic Foot Exam - Simple   No data filed     Lab Results  Component Value Date   TSH 1.422  07/01/2015   Lab Results  Component Value Date   WBC 9.9 07/28/2021   HGB 10.3 (L) 07/28/2021   HCT 29.8 (L) 07/28/2021   MCV 88 07/28/2021   PLT 567 (H) 07/28/2021   Lab Results  Component Value Date   NA 130 (L) 07/28/2021   K 4.7 07/28/2021   CO2 23 07/28/2021  GLUCOSE 79 07/28/2021   BUN 12 07/28/2021   CREATININE 0.73 07/28/2021   BILITOT 0.2 07/28/2021   ALKPHOS 116 07/28/2021   AST 14 07/28/2021   ALT 14 07/28/2021   PROT 7.9 07/28/2021   ALBUMIN 4.0 07/28/2021   CALCIUM 9.8 07/28/2021   ANIONGAP 8 07/03/2015   EGFR 94 07/28/2021   Lab Results  Component Value Date   CHOL 264 (H) 07/28/2021   CHOL 262 (H) 06/23/2015   Lab Results  Component Value Date   HDL 69 07/28/2021   HDL 93 06/23/2015   Lab Results  Component Value Date   LDLCALC 181 (H) 07/28/2021   LDLCALC 149 (H) 06/23/2015   Lab Results  Component Value Date   TRIG 82 07/28/2021   TRIG 100 06/23/2015   Lab Results  Component Value Date   CHOLHDL 3.8 07/28/2021   CHOLHDL 2.8 06/23/2015   Lab Results  Component Value Date   HGBA1C 4.9 07/28/2021       Assessment & Plan:   Problem List Items Addressed This Visit       Cardiovascular and Mediastinum   Essential hypertension   Relevant Medications   lisinopril (ZESTRIL) 20 MG tablet Discussed at length the importance of initiating treatment for B/P, patient states, "I will try it, but if it's not working when I check my blood pressure at home, I'm going to stop taking it." Encouraged continued diet and exercise efforts  Encouraged continued compliance with medication     Other Visit Diagnoses     Fluid retention    -  Primary   Relevant Orders   POCT URINALYSIS DIP (CLINITEK) Encouraged continued to medication compliance  Encouraged continued management of diet and sodium intake   Thrombocytosis       Relevant Orders   Ambulatory referral to Hematology / Oncology Upon reevaluation of lab trends, noted gradual worsening of  thrombocytopenia, referral to Hematology/ Oncology completed    Follow up in 2 mths for reevaluation of B/P, sooner as needed    I am having Ladene Artist start on lisinopril. I am also having her maintain her escitalopram, gabapentin, zolpidem, albuterol, furosemide, hydrOXYzine, and ARIPiprazole.  Meds ordered this encounter  Medications   lisinopril (ZESTRIL) 20 MG tablet    Sig: Take 1 tablet (20 mg total) by mouth daily.    Dispense:  60 tablet    Refill:  0     Teena Dunk, NP

## 2021-09-04 ENCOUNTER — Other Ambulatory Visit: Payer: Self-pay

## 2021-09-11 ENCOUNTER — Telehealth (HOSPITAL_COMMUNITY): Payer: Self-pay | Admitting: *Deleted

## 2021-09-11 NOTE — Telephone Encounter (Signed)
Called to request new rx for her Lexapro, she has one pill left, per chart this is correct. Eddie PA is out of the office today and wont be back till Tues. Will put request in but told her it may not be called in before next week.

## 2021-09-15 ENCOUNTER — Other Ambulatory Visit (HOSPITAL_COMMUNITY): Payer: Self-pay | Admitting: Physician Assistant

## 2021-09-15 DIAGNOSIS — F331 Major depressive disorder, recurrent, moderate: Secondary | ICD-10-CM

## 2021-09-15 MED ORDER — ESCITALOPRAM OXALATE 20 MG PO TABS
20.0000 mg | ORAL_TABLET | Freq: Every day | ORAL | 1 refills | Status: DC
  Filled 2021-09-15: qty 30, 30d supply, fill #0
  Filled 2021-10-22: qty 30, 30d supply, fill #1

## 2021-09-15 NOTE — Telephone Encounter (Signed)
Provider was contacted by Alisha Franco. Reola Calkins, RN regarding medication refills for this patient. Patient's medication to be e-prescribed to pharmacy of choice.

## 2021-09-15 NOTE — Progress Notes (Signed)
Provider was contacted by Suzanne K. Beck, RN regarding medication refills for this patient. Patient's medication to be e-prescribed to pharmacy of choice.

## 2021-09-16 ENCOUNTER — Other Ambulatory Visit: Payer: Self-pay

## 2021-09-17 ENCOUNTER — Other Ambulatory Visit: Payer: Self-pay

## 2021-09-18 ENCOUNTER — Ambulatory Visit (INDEPENDENT_AMBULATORY_CARE_PROVIDER_SITE_OTHER): Payer: Self-pay | Admitting: Internal Medicine

## 2021-09-18 ENCOUNTER — Encounter: Payer: Self-pay | Admitting: Internal Medicine

## 2021-09-18 ENCOUNTER — Other Ambulatory Visit: Payer: Self-pay

## 2021-09-18 VITALS — BP 180/98 | HR 99 | Resp 20 | Ht 66.0 in | Wt 115.2 lb

## 2021-09-18 DIAGNOSIS — I10 Essential (primary) hypertension: Secondary | ICD-10-CM

## 2021-09-18 NOTE — Patient Instructions (Signed)
Medication Instructions:  Your physician recommends that you continue on your current medications as directed. Please refer to the Current Medication list given to you today.  Follow-Up: At [facility], you and your health needs are our priority.  As part of our continuing mission to provide you with exceptional heart care, we have created designated Provider Care Teams.  These Care Teams include your primary Cardiologist (physician) and Advanced Practice Providers (APPs -  Physician Assistants and Nurse Practitioners) who all work together to provide you with the care you need, when you need it.  We recommend signing up for the patient portal called "MyChart".  Sign up information is provided on this After Visit Summary.  MyChart is used to connect with patients for Virtual Visits (Telemedicine).  Patients are able to view lab/test results, encounter notes, upcoming appointments, etc.  Non-urgent messages can be sent to your provider as well.   To learn more about what you can do with MyChart, go to [email or web address].    Your next appointment:   AS NEEDED with Dr. Branch    

## 2021-09-18 NOTE — Progress Notes (Signed)
Cardiology Office Note:    Date:  09/18/2021   ID:  Alisha Franco, DOB 1961/07/20, MRN [id number]  PCP:  Kathrynn Speed, NP   [facility] Providers Cardiologist:  None     Referring MD: Kathrynn Speed, NP   No chief complaint on file. HTN,   History of Present Illness:    Alisha Franco is a 60 y.o. female with a hx of  HTN, elevated BNP  She was passed out at the grocery store. She was taking an antidepressant at night and felt loopy the next day. Since she changed her medications she has not had any more issues. At home her blood pressure fluctuate at home and she is not sure what they are. She does not want to make any changes in her medications. She is not employed and does not have insurance. She is concerned about cost. She denies angina, dyspnea on exertion, PND or orthopnea. She notes that her legs get swollen, when she gets enough sleep that resolves. She has postnasal drip. No MI history. NO stress test or heart cath.  BP high today 180/98 mmHg. Parents had a stroke. 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   Cardiology Studies TTE LV fxn nl, RV fxn normal PASP 33 mmHg Mild-moderate MR   07/28/2021 LDL 181  07/28/2021 BNP 900     Past Medical History:  Diagnosis Date   Acute bronchitis, bacterial    Hypertension     Past Surgical History:  Procedure Laterality Date   btl     depression     TUBAL LIGATION      Current Medications: Current Meds  Medication Sig   albuterol (VENTOLIN HFA) 108 (90 Base) MCG/ACT inhaler Inhale 2 puffs into the lungs every 4 (four) hours as needed.   ARIPiprazole (ABILIFY) 2 MG tablet Take 1 tablet (2 mg total) by mouth daily.   ergocalciferol (VITAMIN D2) 1.25 MG (50000 UT) capsule 1 capsule   escitalopram (LEXAPRO) 20 MG tablet Take 1 tablet (20 mg total) by mouth daily.   furosemide (LASIX) 20 MG tablet Take 1 tablet (20 mg total) by mouth 2 (two) times daily.   zolpidem (AMBIEN) 5 MG tablet Take 1 tablet  (5 mg total) by mouth at bedtime as needed for sleep.   [DISCONTINUED] hydrOXYzine (ATARAX/VISTARIL) 10 MG tablet Take 1 tablet (10 mg total) by mouth 3 (three) times daily as needed.     Allergies:   Patient has no known allergies.   Social History   Socioeconomic History   Marital status: Legally Separated    Spouse name: Not on file   Number of children: Not on file   Years of education: Not on file   Highest education level: Not on file  Occupational History   Not on file  Tobacco Use   Smoking status: Every Day    Packs/day: 1.00    Years: 40.00    Pack years: 40.00    Types: Cigarettes   Smokeless tobacco: Never  Vaping Use   Vaping Use: Never used  Substance and Sexual Activity   Alcohol use: Yes   Drug use: No   Sexual activity: Not Currently  Other Topics Concern   Not on file  Social History Narrative   Not on file   Social Determinants of Health   Financial Resource Strain: Not on file  Food Insecurity: Not on file  Transportation Needs: Not on file  Physical Activity: Not on file  Stress: Not  on file  Social Connections: Not on file     Family History: The patient's family history includes Stroke in her father and mother.  ROS:   Please see the history of present illness.     All other systems reviewed and are negative.  EKGs/Labs/Other Studies Reviewed:    The following studies were reviewed today:   EKG:  EKG is  ordered today.  The ekg ordered today demonstrates  Patient refused  Recent Labs: 07/28/2021: ALT 14; BUN 12; Creatinine, Ser 0.73; Hemoglobin 10.3; NT-Pro BNP CANCELED; Platelets 567; Potassium 4.7; Sodium 130  Recent Lipid Panel    Component Value Date/Time   CHOL 264 (H) 07/28/2021 0923   TRIG 82 07/28/2021 0923   HDL 69 07/28/2021 0923   CHOLHDL 3.8 07/28/2021 0923   CHOLHDL 2.8 06/23/2015 1403   VLDL 20 06/23/2015 1403   LDLCALC 181 (H) 07/28/2021 0923     Risk Assessment/Calculations:           Physical Exam:     VS:  Resp 20   Ht 5\' 6"  (1.676 m)   Wt 115 lb 3.2 oz (52.3 kg)   BMI 18.59 kg/m     Wt Readings from Last 3 Encounters:  09/18/21 115 lb 3.2 oz (52.3 kg)  08/28/21 114 lb 3.2 oz (51.8 kg)  07/28/21 107 lb 0.6 oz (48.6 kg)     GEN:  Well nourished, well developed in no acute distress, thin HEENT: Normal NECK: No JVD; No carotid bruits Vasc: 2+ radial BL CARDIAC: RRR, no murmurs, rubs, gallops RESPIRATORY:  Clear to auscultation without rales, wheezing or rhonchi  ABDOMEN: Soft, non-tender, non-distended MUSCULOSKELETAL:  No edema; No deformity  SKIN: Warm and dry NEUROLOGIC:  Alert and oriented x 3 PSYCHIATRIC:  Normal affect   ASSESSMENT:    #HTN: blood pressure elevated, however patient did not want to change her medications Limited in terms of being able to help with this. IF she is able to get health insurance and more amenable, happy to see her again. Otherwise will see her on an as needed bases.   #Elevated BNP: She has no signs of heart failure. Some data suggests BNP can be elevated with autoimmune dx, with elevated plt can be inflammatory.  Fish-Trotter et al Inflammation and Circulating Natriuretic Peptide Levels. CIRCHEARTFAILURE 2020.Consider ANA; autoimmune w/u  PLAN:    In order of problems listed above:  PRN follow up        Medication Adjustments/Labs and Tests Ordered: Current medicines are reviewed at length with the patient today.  Concerns regarding medicines are outlined above.   Signed, 07/30/21, MD  09/18/2021 1:49 PM     [facility]

## 2021-09-23 ENCOUNTER — Other Ambulatory Visit: Payer: Self-pay

## 2021-09-24 ENCOUNTER — Other Ambulatory Visit: Payer: Self-pay

## 2021-09-27 IMAGING — CR DG CHEST 2V
2 series · 2 of 2 positions shown · non-contrast
Comparison: 07/01/2015

CLINICAL DATA: Cough 5 years

EXAM:
CHEST - 2 VIEW

[w chest pa]
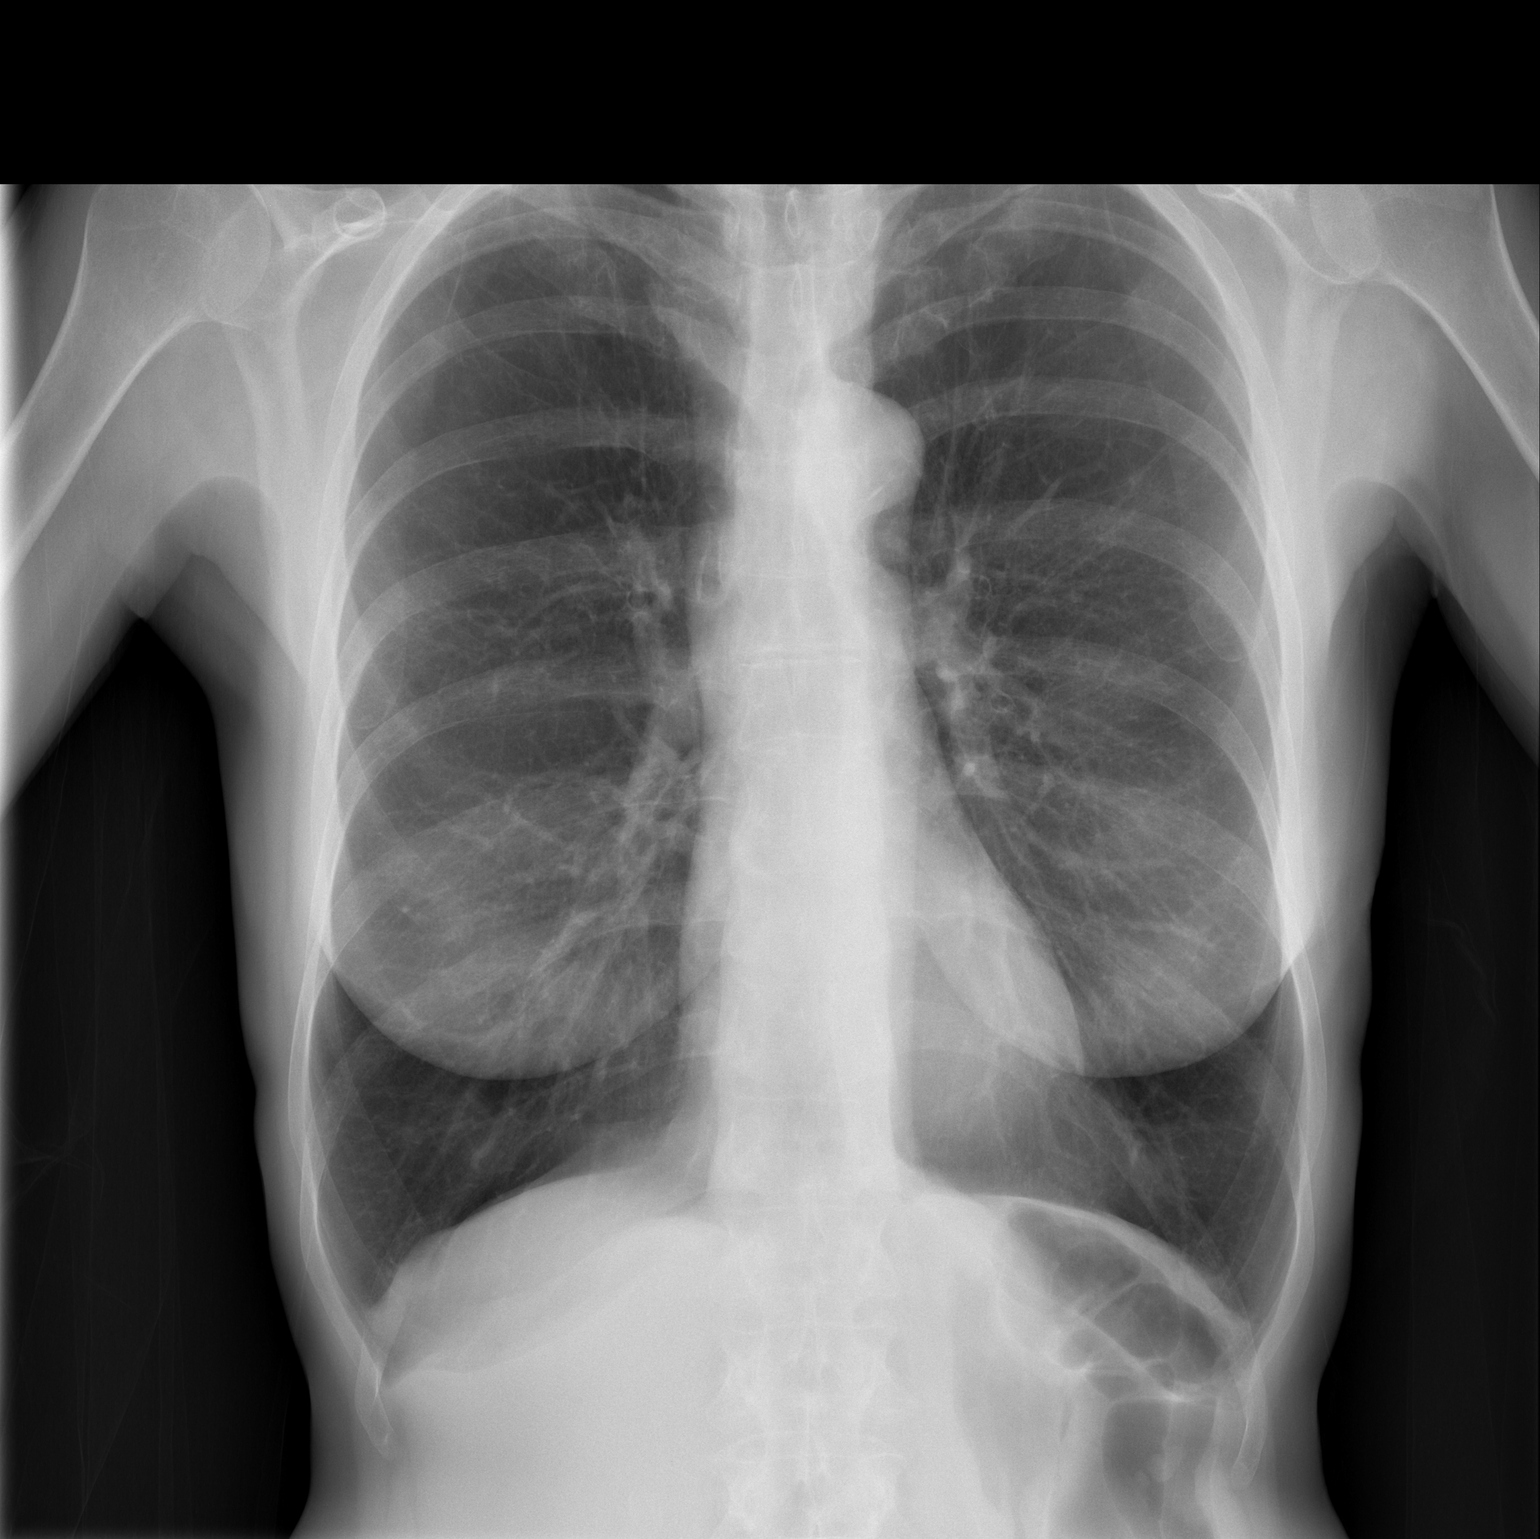

[w chest lat]
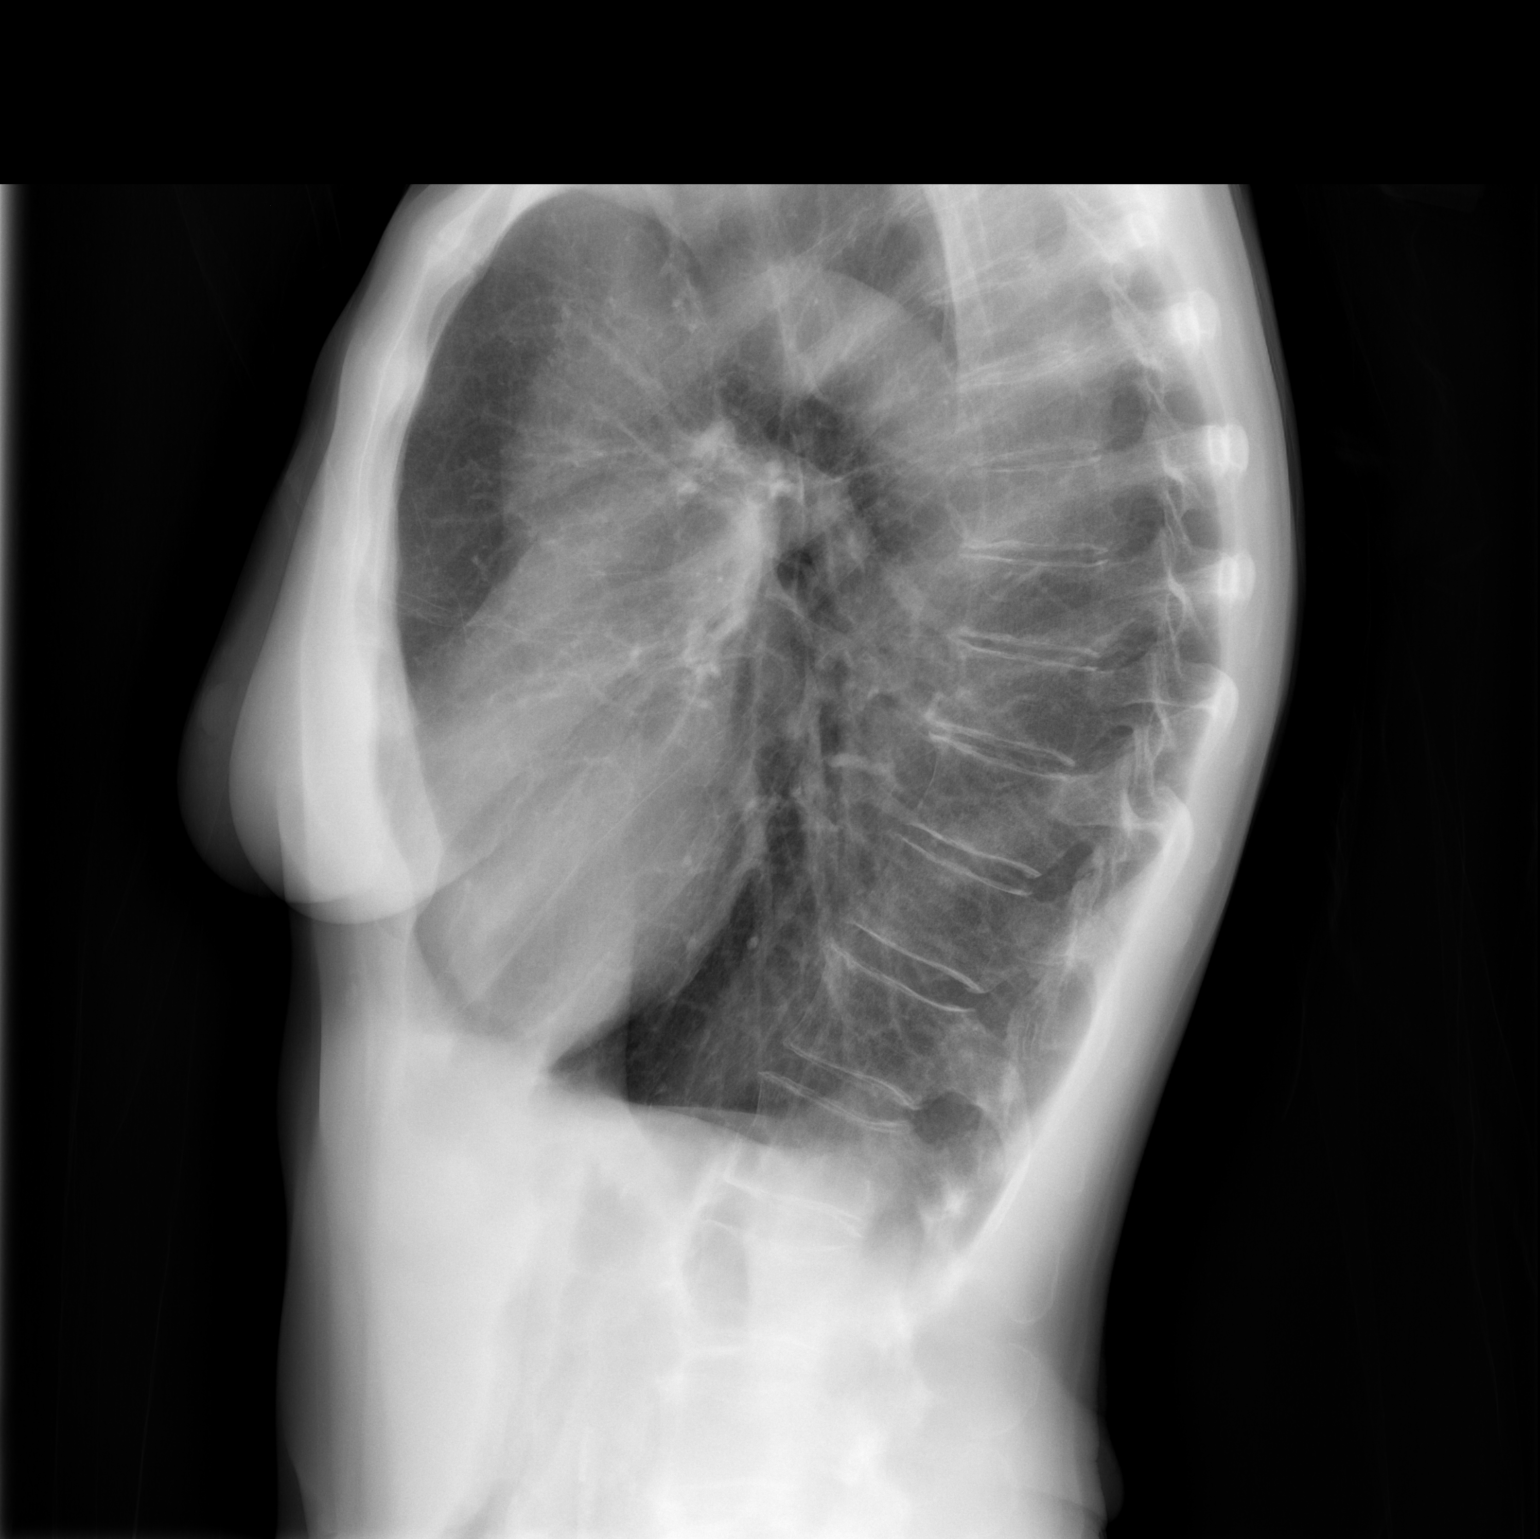

[2 of 2 positions shown; findings below may reference images not displayed]

FINDINGS: Marked pulmonary hyperinflation. Lungs are well aerated and clear
without infiltrate or effusion. Heart size and vascularity normal.
No acute skeletal abnormality.
IMPRESSION: Pulmonary hyperinflation without acute abnormality.

## 2021-10-06 ENCOUNTER — Encounter (HOSPITAL_COMMUNITY): Payer: No Payment, Other | Admitting: Physician Assistant

## 2021-10-22 ENCOUNTER — Other Ambulatory Visit: Payer: Self-pay

## 2021-10-27 ENCOUNTER — Ambulatory Visit: Payer: Self-pay | Admitting: Nurse Practitioner

## 2021-10-28 ENCOUNTER — Encounter: Payer: Self-pay | Admitting: Nurse Practitioner

## 2021-10-28 ENCOUNTER — Ambulatory Visit (INDEPENDENT_AMBULATORY_CARE_PROVIDER_SITE_OTHER): Payer: No Payment, Other | Admitting: Nurse Practitioner

## 2021-10-28 ENCOUNTER — Other Ambulatory Visit: Payer: Self-pay

## 2021-10-28 ENCOUNTER — Telehealth (HOSPITAL_COMMUNITY): Payer: Self-pay | Admitting: *Deleted

## 2021-10-28 VITALS — BP 164/85 | HR 94 | Temp 97.9°F | Ht 66.0 in | Wt 115.0 lb

## 2021-10-28 DIAGNOSIS — I10 Essential (primary) hypertension: Secondary | ICD-10-CM

## 2021-10-28 DIAGNOSIS — E871 Hypo-osmolality and hyponatremia: Secondary | ICD-10-CM

## 2021-10-28 DIAGNOSIS — R609 Edema, unspecified: Secondary | ICD-10-CM

## 2021-10-28 DIAGNOSIS — D75839 Thrombocytosis, unspecified: Secondary | ICD-10-CM

## 2021-10-28 NOTE — Progress Notes (Signed)
[place], [place]  [address] Phone:  [phone]   Fax:  [phone] Subjective:   Patient ID: Alisha Franco, female    DOB: Apr 14, 1961, 60 y.o.   MRN: [id number]  Chief Complaint  Patient presents with   Follow-up    2 month follow up, patient stated she never started Lisinopril because she read online it has too many side effects. Stated she does not want to be on any Blood pressure medication.    HPI Alisha Franco 60 y.o. female  has a past medical history of Acute bronchitis, bacterial and Hypertension. To the [facility] for reevaluation of fluid retention, depression and hypertension.  Patient states that she did not begin the medication for hypertension due to concern for side effects. Has been monitoring diet at home regularly and has been compliant with medications for fluid retention.   States that swelling in BLE has almost resolved. Has some swelling in the right ankle and the left ankle has completely resolved. Denies any HA, dizziness, blurred vision, chest pain or shortness of breath. States that she has had a cough with blood tinged sputum for the past 2 wks, that has also resolved. Indicates that depression has improved, she is more active at home and states, " I feel overall better." She is excited, because her sister is [place] from [place] in 2 mths.  Requesting repeat labs be completed today. States that she is concerned about the mild decrease in sodium and mild elevation in platelets from previous labs, would like to see if her change in diet helped her labs to improve. Denies any changes in tobacco and/ alcohol abuse.   Denies any other complaints today. Denies any fatigue, chest pain, shortness of breath, HA or dizziness. Denies any blurred vision, numbness or tingling.  Past Medical History:  Diagnosis Date   Acute bronchitis, bacterial    Hypertension     Past Surgical History:  Procedure Laterality Date   btl      depression     TUBAL LIGATION      Family History  Problem Relation Age of Onset   Stroke Mother    Stroke Father     Social History   Socioeconomic History   Marital status: Legally Separated    Spouse name: Not on file   Number of children: Not on file   Years of education: Not on file   Highest education level: Not on file  Occupational History   Not on file  Tobacco Use   Smoking status: Every Day    Packs/day: 1.00    Years: 40.00    Pack years: 40.00    Types: Cigarettes   Smokeless tobacco: Never  Vaping Use   Vaping Use: Never used  Substance and Sexual Activity   Alcohol use: Yes    Comment: 28 beer/week   Drug use: No   Sexual activity: Not Currently  Other Topics Concern   Not on file  Social History Narrative   Not on file   Social Determinants of Health   Financial Resource Strain: Not on file  Food Insecurity: Not on file  Transportation Needs: Not on file  Physical Activity: Not on file  Stress: Not on file  Social Connections: Not on file  Intimate Partner Violence: Not on file    Outpatient Medications Prior to Visit  Medication Sig Dispense Refill   albuterol (VENTOLIN HFA) 108 (90 Base) MCG/ACT inhaler Inhale 2 puffs into the  lungs every 4 (four) hours as needed.     ARIPiprazole (ABILIFY) 2 MG tablet Take 1 tablet (2 mg total) by mouth daily. 30 tablet 1   ergocalciferol (VITAMIN D2) 1.25 MG (50000 UT) capsule 1 capsule     escitalopram (LEXAPRO) 20 MG tablet Take 1 tablet (20 mg total) by mouth daily. 30 tablet 1   furosemide (LASIX) 20 MG tablet Take 1 tablet (20 mg total) by mouth 2 (two) times daily. 60 tablet 11   zolpidem (AMBIEN) 5 MG tablet Take 1 tablet (5 mg total) by mouth at bedtime as needed for sleep. 30 tablet 0   No facility-administered medications prior to visit.    No Known Allergies  Review of Systems  Constitutional:  Negative for chills, fever and malaise/fatigue.  HENT: Negative.    Eyes: Negative.    Respiratory:  Negative for cough and shortness of breath.   Cardiovascular:  Negative for chest pain, palpitations and leg swelling.  Gastrointestinal:  Negative for abdominal pain, blood in stool, constipation, diarrhea, nausea and vomiting.  Musculoskeletal: Negative.   Skin: Negative.   Neurological: Negative.   Psychiatric/Behavioral:  Negative for depression. The patient is not nervous/anxious.   All other systems reviewed and are negative.     Objective:    Physical Exam Vitals reviewed.  Constitutional:      General: She is not in acute distress.    Appearance: Normal appearance. She is normal weight.  HENT:     Head: Normocephalic.  Cardiovascular:     Rate and Rhythm: Normal rate and regular rhythm.     Pulses: Normal pulses.     Heart sounds: Normal heart sounds.     Comments: No obvious peripheral edema Pulmonary:     Effort: Pulmonary effort is normal.     Breath sounds: Normal breath sounds.  Musculoskeletal:        General: No swelling. Normal range of motion.     Right lower leg: No edema.     Left lower leg: No edema.  Skin:    General: Skin is warm and dry.     Capillary Refill: Capillary refill takes less than 2 seconds.  Neurological:     General: No focal deficit present.     Mental Status: She is alert and oriented to person, place, and time.  Psychiatric:        Mood and Affect: Mood normal.        Behavior: Behavior normal.        Thought Content: Thought content normal.        Judgment: Judgment normal.    BP (!) 164/85 (BP Location: Right Arm, Patient Position: Sitting)    Pulse 94    Temp 97.9 F (36.6 C)    Ht 5' 6"  (1.676 m)    Wt 115 lb (52.2 kg)    SpO2 100%    BMI 18.56 kg/m  Wt Readings from Last 3 Encounters:  10/28/21 115 lb (52.2 kg)  09/18/21 115 lb 3.2 oz (52.3 kg)  08/28/21 114 lb 3.2 oz (51.8 kg)    Immunization History  Administered Date(s) Administered   Unspecified SARS-COV-2 Vaccination 03/19/2020    Diabetic Foot  Exam - Simple   No data filed     Lab Results  Component Value Date   TSH 1.422 07/01/2015   Lab Results  Component Value Date   WBC 9.9 07/28/2021   HGB 10.3 (L) 07/28/2021   HCT 29.8 (L) 07/28/2021   MCV  88 07/28/2021   PLT 567 (H) 07/28/2021   Lab Results  Component Value Date   NA 130 (L) 07/28/2021   K 4.7 07/28/2021   CO2 23 07/28/2021   GLUCOSE 79 07/28/2021   BUN 12 07/28/2021   CREATININE 0.73 07/28/2021   BILITOT 0.2 07/28/2021   ALKPHOS 116 07/28/2021   AST 14 07/28/2021   ALT 14 07/28/2021   PROT 7.9 07/28/2021   ALBUMIN 4.0 07/28/2021   CALCIUM 9.8 07/28/2021   ANIONGAP 8 07/03/2015   EGFR 94 07/28/2021   Lab Results  Component Value Date   CHOL 264 (H) 07/28/2021   CHOL 262 (H) 06/23/2015   Lab Results  Component Value Date   HDL 69 07/28/2021   HDL 93 06/23/2015   Lab Results  Component Value Date   LDLCALC 181 (H) 07/28/2021   LDLCALC 149 (H) 06/23/2015   Lab Results  Component Value Date   TRIG 82 07/28/2021   TRIG 100 06/23/2015   Lab Results  Component Value Date   CHOLHDL 3.8 07/28/2021   CHOLHDL 2.8 06/23/2015   Lab Results  Component Value Date   HGBA1C 4.9 07/28/2021       Assessment & Plan:   Problem List Items Addressed This Visit       Cardiovascular and Mediastinum   Essential hypertension Encouraged continued diet and exercise efforts       Other   Hyponatremia   Relevant Orders   Comprehensive metabolic panel   Other Visit Diagnoses     Fluid retention    -  Primary Encouraged continued diet and exercise efforts  Encouraged continued compliance with medication    Thrombocytosis       Relevant Orders   CBC with Differential/Platelet   Follow up in 6 mths for reevaluation of chronic illness, sooner as needed     I am having Ladene Artist maintain her zolpidem, albuterol, furosemide, ARIPiprazole, escitalopram, and ergocalciferol.  No orders of the defined types were placed in this  encounter.    Teena Dunk, NP

## 2021-10-28 NOTE — Telephone Encounter (Signed)
Call from [place] to ask for a new rx for her Abilify. Will forward concern to her provider for a new rx as she should be out of it. Her next appt is on Nov 19 2021.

## 2021-10-28 NOTE — Patient Instructions (Signed)
You were seen today in the [facility] for reevaluation of B/P and fluid retention. Labs were collected, results will be available via MyChart or, if abnormal, you will be contacted by clinic staff. You were prescribed medications, please take as directed. Please follow up in 6 mths for reevaluation fluid retention and depression.

## 2021-10-29 ENCOUNTER — Other Ambulatory Visit: Payer: Self-pay

## 2021-10-29 ENCOUNTER — Other Ambulatory Visit (HOSPITAL_COMMUNITY): Payer: Self-pay | Admitting: Physician Assistant

## 2021-10-29 DIAGNOSIS — F331 Major depressive disorder, recurrent, moderate: Secondary | ICD-10-CM

## 2021-10-29 LAB — CBC WITH DIFFERENTIAL/PLATELET
Basophils Absolute: 0.1 10*3/uL (ref 0.0–0.2)
Basos: 1 %
EOS (ABSOLUTE): 0.1 10*3/uL (ref 0.0–0.4)
Eos: 1 %
Hematocrit: 40.1 % (ref 34.0–46.6)
Hemoglobin: 13.6 g/dL (ref 11.1–15.9)
Immature Grans (Abs): 0 10*3/uL (ref 0.0–0.1)
Immature Granulocytes: 0 %
Lymphocytes Absolute: 2.5 10*3/uL (ref 0.7–3.1)
Lymphs: 29 %
MCH: 29.1 pg (ref 26.6–33.0)
MCHC: 33.9 g/dL (ref 31.5–35.7)
MCV: 86 fL (ref 79–97)
Monocytes Absolute: 0.8 10*3/uL (ref 0.1–0.9)
Monocytes: 9 %
Neutrophils Absolute: 5.3 10*3/uL (ref 1.4–7.0)
Neutrophils: 60 %
Platelets: 393 10*3/uL (ref 150–450)
RBC: 4.68 x10E6/uL (ref 3.77–5.28)
RDW: 11.8 % (ref 11.7–15.4)
WBC: 8.8 10*3/uL (ref 3.4–10.8)

## 2021-10-29 LAB — COMPREHENSIVE METABOLIC PANEL
ALT: 15 IU/L (ref 0–32)
AST: 21 IU/L (ref 0–40)
Albumin/Globulin Ratio: 1.5 (ref 1.2–2.2)
Albumin: 4.4 g/dL (ref 3.8–4.9)
Alkaline Phosphatase: 133 IU/L — ABNORMAL HIGH (ref 44–121)
BUN/Creatinine Ratio: 14 (ref 12–28)
BUN: 11 mg/dL (ref 8–27)
Bilirubin Total: 0.2 mg/dL (ref 0.0–1.2)
CO2: 24 mmol/L (ref 20–29)
Calcium: 9.7 mg/dL (ref 8.7–10.3)
Chloride: 92 mmol/L — ABNORMAL LOW (ref 96–106)
Creatinine, Ser: 0.76 mg/dL (ref 0.57–1.00)
Globulin, Total: 3 g/dL (ref 1.5–4.5)
Glucose: 73 mg/dL (ref 70–99)
Potassium: 4.9 mmol/L (ref 3.5–5.2)
Sodium: 129 mmol/L — ABNORMAL LOW (ref 134–144)
Total Protein: 7.4 g/dL (ref 6.0–8.5)
eGFR: 90 mL/min/{1.73_m2} (ref >59)

## 2021-10-29 MED ORDER — ARIPIPRAZOLE 2 MG PO TABS
2.0000 mg | ORAL_TABLET | Freq: Every day | ORAL | 1 refills | Status: DC
  Filled 2021-10-29: qty 30, 30d supply, fill #0

## 2021-10-29 NOTE — Telephone Encounter (Signed)
Provider was contacted by Suzanne K. Beck, RN regarding patient's medication refill request. Patient's medication to be e-prescribed to pharmacy of choice.

## 2021-10-29 NOTE — Progress Notes (Signed)
Provider was contacted by Suzanne K. Beck, RN regarding patient's medication refill request. Patient's medication to be e-prescribed to pharmacy of choice.

## 2021-10-30 ENCOUNTER — Other Ambulatory Visit: Payer: Self-pay

## 2021-11-19 ENCOUNTER — Other Ambulatory Visit (HOSPITAL_COMMUNITY): Payer: Self-pay

## 2021-11-19 ENCOUNTER — Encounter (HOSPITAL_COMMUNITY): Payer: Self-pay | Admitting: Physician Assistant

## 2021-11-19 ENCOUNTER — Other Ambulatory Visit: Payer: Self-pay

## 2021-11-19 ENCOUNTER — Ambulatory Visit (INDEPENDENT_AMBULATORY_CARE_PROVIDER_SITE_OTHER): Payer: No Payment, Other | Admitting: Physician Assistant

## 2021-11-19 VITALS — BP 179/95 | HR 92 | Ht 66.0 in | Wt 117.0 lb

## 2021-11-19 DIAGNOSIS — G479 Sleep disorder, unspecified: Secondary | ICD-10-CM

## 2021-11-19 DIAGNOSIS — F411 Generalized anxiety disorder: Secondary | ICD-10-CM

## 2021-11-19 DIAGNOSIS — F331 Major depressive disorder, recurrent, moderate: Secondary | ICD-10-CM

## 2021-11-19 MED ORDER — ESCITALOPRAM OXALATE 20 MG PO TABS
40.0000 mg | ORAL_TABLET | Freq: Every day | ORAL | 1 refills | Status: DC
  Filled 2021-11-19: qty 60, 30d supply, fill #0

## 2021-11-19 MED ORDER — HYDROXYZINE HCL 10 MG PO TABS
10.0000 mg | ORAL_TABLET | Freq: Three times a day (TID) | ORAL | 1 refills | Status: DC | PRN
  Filled 2021-11-19: qty 90, 30d supply, fill #0

## 2021-11-19 NOTE — Progress Notes (Signed)
BH MD/PA/NP OP Progress Note  11/19/2021 4:42 PM Alisha DanaSabine Quiles  MRN:  [id number]  Chief Complaint:  Chief Complaint   Medication Management    HPI:   Alisha Franco is a 61 year old female with a past psychiatric history significant for anxiety, major depressive disorder, and sleep disturbances who presents to [facility] visit for follow-up and medication management.  Patient is currently being managed on the following medications:  Ambien 5 mg at bedtime Hydroxyzine 10 mg 3 times daily as needed Abilify 2 mg daily Escitalopram 20 mg daily  Patient feels that her Abilify did not do too much for her.  Provider informed patient that the ineffectiveness of the Abilify could be attributed to the fact that she was started on a low-dose.  She also notes that she experienced a lot of side effects from the medication resulting in discontinuation of the medication.  She reports that she still continues to take her Lexapro and is maintaining her stability.  She endorses increased activity, however, she is plagued with limited focus and often finds herself stopping in the middle of an activity she is engaged in.  Patient endorses increased anxiety and reports that her hydroxyzine has been helpful with her anxiety and sleep.  She still occasionally experiences bad night prompting her to take 2 Ambien in an attempt to go to sleep.  She reports that it took her roughly 3 hours to fall asleep.  She endorses elevated anxiety due to her birthday slowly approaching.  She reports wanting to go back to work but is afraid of not being as stable as she would like and would rather stay home.  A PHQ-9 screen was performed with the patient scoring a 14.  A GAD-7 screen was also performed with the patient scoring an 8.  Patient is alert and oriented x4, calm, cooperative, and fully engaged in conversation during the encounter.  Patient notes that she is usually very upbeat but often  beats herself up for not getting the things that she needs done completed.  Patient denies suicidal or homicidal ideation.  She further denies auditory or visual hallucinations and does not appear to be responding to internal/external stimuli.  Patient endorses fluctuating sleep stating that she receives anywhere between 7 to 8 hours of sleep each night.  She reports that her smart watch reported that out of the sleep that she received the night before, only receives 4 hours and 18 minutes of sleep.  Patient endorses good appetite and eats on average 3 meals per day.  Patient endorses moderate alcohol consumption.  Patient endorses tobacco use and smokes on average a pack every 2 days.  Patient denies illicit drug use.  Visit Diagnosis:    ICD-10-CM   1. Anxiety state  F41.1 hydrOXYzine (ATARAX) 10 MG tablet    2. Moderate episode of recurrent major depressive disorder (HCC)  F33.1 escitalopram (LEXAPRO) 20 MG tablet    3. Sleep disturbance  G47.9       Past Psychiatric History:  Major depressive disorder Generalized anxiety disorder Insomnia  Past Medical History:  Past Medical History:  Diagnosis Date   Acute bronchitis, bacterial    Hypertension     Past Surgical History:  Procedure Laterality Date   btl     depression     TUBAL LIGATION      Family Psychiatric History:  Mother - Depression, patient states that her mother's depression worsened after the patient's father had experienced 2 strokes with  the second stroke killing him  Family History:  Family History  Problem Relation Age of Onset   Stroke Mother    Stroke Father     Social History:  Social History   Socioeconomic History   Marital status: Legally Separated    Spouse name: Not on file   Number of children: Not on file   Years of education: Not on file   Highest education level: Not on file  Occupational History   Not on file  Tobacco Use   Smoking status: Every Day    Packs/day: 1.00    Years:  40.00    Pack years: 40.00    Types: Cigarettes   Smokeless tobacco: Never  Vaping Use   Vaping Use: Never used  Substance and Sexual Activity   Alcohol use: Yes    Comment: 28 beer/week   Drug use: No   Sexual activity: Not Currently  Other Topics Concern   Not on file  Social History Narrative   Not on file   Social Determinants of Health   Financial Resource Strain: Not on file  Food Insecurity: Not on file  Transportation Needs: Not on file  Physical Activity: Not on file  Stress: Not on file  Social Connections: Not on file    Allergies: No Known Allergies  Metabolic Disorder Labs: Lab Results  Component Value Date   HGBA1C 4.9 07/28/2021   No results found for: PROLACTIN Lab Results  Component Value Date   CHOL 264 (H) 07/28/2021   TRIG 82 07/28/2021   HDL 69 07/28/2021   CHOLHDL 3.8 07/28/2021   VLDL 20 06/23/2015   LDLCALC 181 (H) 07/28/2021   LDLCALC 149 (H) 06/23/2015   Lab Results  Component Value Date   TSH 1.422 07/01/2015    Therapeutic Level Labs: No results found for: LITHIUM No results found for: VALPROATE No components found for:  CBMZ  Current Medications: Current Outpatient Medications  Medication Sig Dispense Refill   albuterol (VENTOLIN HFA) 108 (90 Base) MCG/ACT inhaler Inhale 2 puffs into the lungs every 4 (four) hours as needed.     ARIPiprazole (ABILIFY) 2 MG tablet Take 1 tablet (2 mg total) by mouth daily. 30 tablet 1   ergocalciferol (VITAMIN D2) 1.25 MG (50000 UT) capsule 1 capsule     furosemide (LASIX) 20 MG tablet Take 1 tablet (20 mg total) by mouth 2 (two) times daily. 60 tablet 11   hydrOXYzine (ATARAX) 10 MG tablet Take 1 tablet (10 mg total) by mouth 3 (three) times daily as needed for anxiety. 90 tablet 1   zolpidem (AMBIEN) 5 MG tablet Take 1 tablet (5 mg total) by mouth at bedtime as needed for sleep. 30 tablet 0   escitalopram (LEXAPRO) 20 MG tablet Take 2 tablets (40 mg total) by mouth daily. 60 tablet 1   No  current facility-administered medications for this visit.     Musculoskeletal: Strength & Muscle Tone: within normal limits Gait & Station: normal Patient leans: N/A  Psychiatric Specialty Exam: Review of Systems  Psychiatric/Behavioral:  Positive for decreased concentration and sleep disturbance. Negative for dysphoric mood, hallucinations, self-injury and suicidal ideas. The patient is nervous/anxious. The patient is not hyperactive.    Blood pressure (!) 179/95, pulse 92, height 5\' 6"  (1.676 m), weight 117 lb (53.1 kg).Body mass index is 18.88 kg/m.  General Appearance: Well Groomed  Eye Contact:  Good  Speech:  Clear and Coherent and Normal Rate  Volume:  Normal  Mood:  Anxious  and Depressed  Affect:  Congruent  Thought Process:  Coherent and Descriptions of Associations: Intact  Orientation:  Full (Time, Place, and Person)  Thought Content: WDL   Suicidal Thoughts:  No  Homicidal Thoughts:  No  Memory:  Immediate;   Good Recent;   Good Remote;   Good  Judgement:  Good  Insight:  Good  Psychomotor Activity:  Normal  Concentration:  Concentration: Good and Attention Span: Good  Recall:  Good  Fund of Knowledge: Good  Language: Good  Akathisia:  No  Handed:  Right  AIMS (if indicated): not done  Assets:  Communication Skills Desire for Improvement Housing Social Support  ADL's:  Intact  Cognition: WNL  Sleep:  Fair   Screenings: GAD-7    Flowsheet Row [facility] Office Visit from 06/25/2021 in [facility]  Total GAD-7 Score 8 11 17       PHQ2-9    Flowsheet Row Office Visit from 11/19/2021 in [facility] Office Visit from 10/28/2021 in [facility] Office Visit from 08/28/2021 in [facility] Office Visit from 08/18/2021 in [facility] Office Visit from 06/25/2021 in [facility]  PHQ-2 Total Score 4 0 0 3 4  PHQ-9 Total Score 14 -- -- 12 12      Flowsheet Row Office Visit from 11/19/2021 in [facility] Office Visit from 08/18/2021 in [facility] Office Visit from 06/25/2021 in [facility]  C-SSRS RISK CATEGORY Low Risk Low Risk Low Risk        Assessment and Plan:   Tashay Bozich is a 61 year old female with a past psychiatric history significant for anxiety, major depressive disorder, and sleep disturbances who presents to [facility] visit for follow-up and medication management.  Patient has discontinued taking Abilify after noting its ineffectiveness in managing her symptoms.  Patient continues to have issues with her anxiety and sleep.  She reports that she wants took 2 Ambien in an attempt to fall asleep.  Provider advised patient to refrain from taking more than 1 pill of Ambien.  Provider recommended patient increase her dosage of Lexapro from 20 mg to 40 mg daily for the management of her depressive symptoms and anxiety.  Patient was agreeable to recommendation.  Patient's medications to be e-prescribed to pharmacy of choice.  1. Moderate episode of recurrent major depressive disorder (HCC)  - escitalopram (LEXAPRO) 20 MG tablet; Take 2 tablets (40 mg total) by mouth daily.  Dispense: 60 tablet; Refill: 1  2. Sleep disturbance Patient to continue taking Ambien 5 mg at bedtime as needed for the management of her sleep  3. Anxiety state  - hydrOXYzine (ATARAX) 10 MG tablet; Take 1 tablet (10 mg total) by mouth 3 (three) times daily as needed for anxiety.  Dispense: 90 tablet; Refill: 1  Patient to follow up in 6 weeks Provider spent a total of 23 minutes with the patient/reviewing patient's chart  [facility], [place] 11/29/2021, 6:02  PM

## 2021-11-20 ENCOUNTER — Other Ambulatory Visit (HOSPITAL_COMMUNITY): Payer: Self-pay

## 2021-11-29 ENCOUNTER — Encounter (HOSPITAL_COMMUNITY): Payer: Self-pay | Admitting: Physician Assistant

## 2021-12-29 ENCOUNTER — Other Ambulatory Visit: Payer: Self-pay

## 2021-12-29 ENCOUNTER — Encounter (HOSPITAL_COMMUNITY): Payer: Self-pay | Admitting: Physician Assistant

## 2021-12-29 ENCOUNTER — Ambulatory Visit (INDEPENDENT_AMBULATORY_CARE_PROVIDER_SITE_OTHER): Payer: No Payment, Other | Admitting: Physician Assistant

## 2021-12-29 DIAGNOSIS — G479 Sleep disorder, unspecified: Secondary | ICD-10-CM

## 2021-12-29 DIAGNOSIS — F411 Generalized anxiety disorder: Secondary | ICD-10-CM

## 2021-12-29 MED ORDER — ZOLPIDEM TARTRATE 5 MG PO TABS: 5.0000 mg | ORAL_TABLET | Freq: Every evening | ORAL | 0 refills | Status: DC | PRN

## 2021-12-29 MED ORDER — HYDROXYZINE HCL 10 MG PO TABS: 10.0000 mg | ORAL_TABLET | Freq: Three times a day (TID) | ORAL | 1 refills | Status: DC | PRN

## 2021-12-29 NOTE — Progress Notes (Signed)
BH MD/PA/NP OP Progress Note  12/29/2021 6:51 PM Alisha Franco  MRN:  [id number]  Chief Complaint:  Chief Complaint  Patient presents with   Medication Management    IN PERSON.  MM FU   HPI:   Alisha Franco is a 61 year old female psychiatric history significant for anxiety, major depressive disorder, and sleep disturbances who presents to [facility] outpatient clinic for follow-up and medication management.  Patient is currently being managed on the following medications:  Lexapro 40 mg daily Hydroxyzine 10 mg 3 times daily as needed Zolpidem 5 mg at bedtime as needed  Patient reports that she stopped taking her Lexapro due to lack of progress and the improvement of her mood.  She reports that she also experienced hair loss that she attributed to taking the Lexapro.  She still continues to take hydroxyzine 10 mg 3 times daily as needed stating that it has been occasionally helpful when trying to go to sleep.  She states that she has ran out of her zolpidem and reports that her sleep has been deeply impacted by the lack of a sleep aid.  Patient reports that she has been trying to preoccupy her time with acquiring a job.  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  She states that she is continuing to apply for a position and is hopeful.  Patient endorses fluctuating mood.  She states that she has energy but often does not know what she wants to do with her energy.  She states that she is able to complete some of her activities of daily living such as cleaning out her house.  Patient states that she continues to experience lack of interest in activities as well as issues with concentration.  Patient also continues to endorse anxiety stating that  she occasionally experiences real bad episodes of butterflies when waking up in the morning.  Patient continues to stress over getting out of her current situation and moving back to [place].  A PHQ-9 screen was performed with the patient scoring an 11.  A GAD-7 screen was also performed with the patient scoring a 14.  Is alert and oriented x4, calm, cooperative, and fully engaged in conversation during the encounter.  Patient endorses sad mood.  Patient denies suicidal or homicidal ideations.  She further denies auditory hallucinations but states that she occasionally experiences seeing things at the corner of her eye.  Patient endorses poor sleep.  Patient endorses good appetite and states that she continues to eat when needed.  Patient endorses moderate alcohol consumption.  She endorses tobacco use and smokes on average 2 packs of cigarettes per day.  Patient denies illicit drug use.  Visit Diagnosis:    ICD-10-CM   1. Anxiety state  F41.1 hydrOXYzine (ATARAX) 10 MG tablet    2. Sleep disturbance  G47.9 zolpidem (AMBIEN) 5 MG tablet      Past Psychiatric History:  Major depressive disorder Generalized anxiety disorder Insomnia  Past Medical History:  Past Medical History:  Diagnosis Date   Acute bronchitis, bacterial    Hypertension     Past Surgical History:  Procedure Laterality Date   btl     depression     TUBAL LIGATION      Family Psychiatric History:  Mother - Depression, patient states that her mother's depression worsened after the patient's father had experienced 2 strokes with the second stroke killing him  Family History:  Family History  Problem Relation Age of Onset   Stroke Mother    Stroke Father     Social History:  Social History   Socioeconomic History   Marital status: Legally Separated    Spouse name: Not on file   Number of children: Not on file   Years of education: Not on file   Highest education level: Not on file  Occupational History    Not on file  Tobacco Use   Smoking status: Every Day    Packs/day: 1.00    Years: 40.00    Pack years: 40.00    Types: Cigarettes   Smokeless tobacco: Never  Vaping Use   Vaping Use: Never used  Substance and Sexual Activity   Alcohol use: Yes    Comment: 28 beer/week   Drug use: No   Sexual activity: Not Currently  Other Topics Concern   Not on file  Social History Narrative   Not on file   Social Determinants of Health   Financial Resource Strain: Not on file  Food Insecurity: Not on file  Transportation Needs: Not on file  Physical Activity: Not on file  Stress: Not on file  Social Connections: Not on file    Allergies: No Known Allergies  Metabolic Disorder Labs: Lab Results  Component Value Date   HGBA1C 4.9 07/28/2021   No results found for: PROLACTIN Lab Results  Component Value Date   CHOL 264 (H) 07/28/2021   TRIG 82 07/28/2021   HDL 69 07/28/2021   CHOLHDL 3.8 07/28/2021   VLDL 20 06/23/2015   LDLCALC 181 (H) 07/28/2021   LDLCALC 149 (H) 06/23/2015   Lab Results  Component Value Date   TSH 1.422 07/01/2015    Therapeutic Level Labs: No results found for: LITHIUM No results found for: VALPROATE No components found for:  CBMZ  Current Medications: Current Outpatient Medications  Medication Sig Dispense Refill   albuterol (VENTOLIN HFA) 108 (90 Base) MCG/ACT inhaler Inhale 2 puffs into the lungs every 4 (four) hours as needed.     ARIPiprazole (ABILIFY) 2 MG tablet Take 1 tablet (2 mg total) by mouth daily. 30 tablet 1   ergocalciferol (VITAMIN D2) 1.25 MG (50000 UT) capsule 1 capsule     escitalopram (LEXAPRO) 20 MG tablet Take 2 tablets (40 mg total) by mouth daily. 60 tablet 1   furosemide (LASIX) 20 MG tablet Take 1 tablet (20 mg total) by mouth 2 (two) times daily. 60 tablet 11   hydrOXYzine (ATARAX) 10 MG tablet Take 1 tablet (10 mg total) by mouth 3 (three) times daily as needed for anxiety. 90 tablet 1   zolpidem (AMBIEN) 5 MG tablet  Take 1 tablet (5 mg total) by mouth at bedtime as needed for sleep. 30 tablet 0   No current facility-administered medications for this visit.     Musculoskeletal: Strength & Muscle Tone: within normal limits Gait & Station: normal Patient leans: N/A  Psychiatric Specialty Exam: Review of Systems  Psychiatric/Behavioral:  Positive for sleep disturbance. Negative for decreased concentration, dysphoric mood, hallucinations, self-injury and suicidal ideas. The patient is nervous/anxious. The patient is not hyperactive.    Blood pressure (!) 164/82, pulse 98, height 5\' 6"  (1.676 m), weight 121 lb (54.9 kg).Body mass index is 19.53 kg/m.  General Appearance: Well Groomed  Eye Contact:  Good  Speech:  Clear and Coherent and Normal Rate  Volume:  Normal  Mood:  Anxious and Depressed  Affect:  Congruent  Thought Process:  Coherent and Descriptions of Associations: Intact  Orientation:  Full (Time, Place, and Person)  Thought Content: WDL   Suicidal Thoughts:  No  Homicidal Thoughts:  No  Memory:  Immediate;   Good Recent;   Good Remote;   Good  Judgement:  Good  Insight:  Good  Psychomotor Activity:  Normal  Concentration:  Concentration: Good and Attention Span: Good  Recall:  Good  Fund of Knowledge: Good  Language: Good  Akathisia:  No  Handed:  Right  AIMS (if indicated): not done  Assets:  Communication Skills Desire for Improvement Housing Social Support  ADL's:  Intact  Cognition: WNL  Sleep:  Poor   Screenings: GAD-7    Flowsheet Row Office Visit from 12/29/2021 in [facility] Office Visit from 11/19/2021 in [facility] Office Visit from 08/18/2021 in [facility] Office Visit from 06/25/2021 in [facility]  Total GAD-7 Score 14 8 11 17       PHQ2-9    Flowsheet Row Office Visit from 12/29/2021 in [facility] Office Visit  from 11/19/2021 in [facility] Office Visit from 10/28/2021 in [facility] Office Visit from 08/28/2021 in [facility] Office Visit from 08/18/2021 in [facility]  PHQ-2 Total Score 3 4 0 0 3  PHQ-9 Total Score 11 14 -- -- 12      Flowsheet Row Office Visit from 12/29/2021 in [facility] Office Visit from 11/19/2021 in [facility] Office Visit from 08/18/2021 in [facility]  C-SSRS RISK CATEGORY Low Risk Low Risk Low Risk        Assessment and Plan:   Alisha Franco is a 61 year old female psychiatric history significant for anxiety, major depressive disorder, and sleep disturbances who presents to [facility] behavioral health outpatient clinic for follow-up and medication management.  Patient reports that she discontinued taking her Lexapro after experiencing side effects from the medication as well as no improvement in her mood or anxiety.  Patient states that she continues to struggle with decreased concentration and lack of interest in activities as well as anxiety.  Patient is hesitant with being placed on new medications due to unforeseen side effects.  Provider suggested patient participate in GeneSight testing to determine what medications work best with her based off her genetics.  Patient is interested in the idea; however, has no insurance.  Provider to look into ways patient can have a procedure performed.  Patient to continue taking her hydroxyzine 10 mg 3 times daily as needed and Ambien 5 mg at bedtime as needed as prescribed.  Patient's medication to be e-prescribed to pharmacy of choice.  1. Anxiety state  - hydrOXYzine (ATARAX) 10 MG tablet; Take 1 tablet (10 mg total) by mouth 3 (three) times daily as needed for anxiety.  Dispense: 90 tablet; Refill: 1  2. Sleep disturbance  - zolpidem (AMBIEN) 5  MG tablet; Take 1 tablet (5 mg total) by mouth at bedtime as needed for sleep.  Dispense: 30 tablet; Refill: 0  Patient to follow up in 6 weeks Provider spent a total of 20 minutes with the patient/reviewing patient's chart  [facility], [place] 12/29/2021, 6:51  PM

## 2022-01-15 ENCOUNTER — Other Ambulatory Visit: Payer: Self-pay

## 2022-01-15 ENCOUNTER — Telehealth: Payer: Self-pay

## 2022-01-15 ENCOUNTER — Ambulatory Visit (INDEPENDENT_AMBULATORY_CARE_PROVIDER_SITE_OTHER): Payer: Self-pay | Admitting: Nurse Practitioner

## 2022-01-15 ENCOUNTER — Encounter: Payer: Self-pay | Admitting: Nurse Practitioner

## 2022-01-15 ENCOUNTER — Other Ambulatory Visit (HOSPITAL_COMMUNITY): Payer: Self-pay

## 2022-01-15 VITALS — BP 183/102 | HR 119 | Temp 98.3°F | Ht 66.0 in | Wt 120.1 lb

## 2022-01-15 DIAGNOSIS — I498 Other specified cardiac arrhythmias: Secondary | ICD-10-CM

## 2022-01-15 DIAGNOSIS — I340 Nonrheumatic mitral (valve) insufficiency: Secondary | ICD-10-CM

## 2022-01-15 DIAGNOSIS — I959 Hypotension, unspecified: Secondary | ICD-10-CM

## 2022-01-15 DIAGNOSIS — I10 Essential (primary) hypertension: Secondary | ICD-10-CM

## 2022-01-15 DIAGNOSIS — R931 Abnormal findings on diagnostic imaging of heart and coronary circulation: Secondary | ICD-10-CM

## 2022-01-15 MED ORDER — METOPROLOL TARTRATE 25 MG PO TABS
12.5000 mg | ORAL_TABLET | Freq: Two times a day (BID) | ORAL | 0 refills | Status: AC
  Filled 2022-01-15: qty 30, 30d supply, fill #0

## 2022-01-15 NOTE — Patient Instructions (Signed)
You were seen today in the [facility] for reevaluation of symptoms. Labs were collected, results will be available via MyChart or, if abnormal, you will be contacted by clinic staff. You were prescribed medications, please take as directed. Please follow up in 3 mths for reevaluation of cardiac symptoms. ?

## 2022-01-15 NOTE — Progress Notes (Signed)
Alisha Franco, [place]  [address] Phone:  [phone]   Fax:  [phone] Subjective:   Patient ID: Alisha Franco, female    DOB: June 26, 1961, 61 y.o.   MRN: [id number]  Chief Complaint  Patient presents with   Follow-up    Pt stated she fainted 3 days ago that why she is here today also pt was stung by wasp and has itching   HPI Alisha Franco 61 y.o. female  has a past medical history of Acute bronchitis, bacterial and Hypertension. To the [facility] for hypotension.  States that 2-3 days ago, she began having dizziness and lightheadedness, with cold sweats. When she checked her B/P, while having symptoms, B/P was 60/70. States that she has been experiencing arrhythmias when laying down, which resolves after a period of time.    Currently drinks four beers per day and smokes 2 packs of cigarettes per day.Also endorses some increase in stressors with regards to anxiety and insomnia. Was recently prescribed Ambien and continues to maintain appointments with psychiatry. Denies any recent trauma or injury. Denies any other complaints today.   Denies any fever. Denies any fatigue, chest pain, shortness of breath and HA. Denies any blurred vision, numbness or tingling.   Past Medical History:  Diagnosis Date   Acute bronchitis, bacterial    Hypertension     Past Surgical History:  Procedure Laterality Date   btl     depression     TUBAL LIGATION      Family History  Problem Relation Age of Onset   Stroke Mother    Stroke Father     Social History   Socioeconomic History   Marital status: Legally Separated    Spouse name: Not on file   Number of children: Not on file   Years of education: Not on file   Highest education level: Not on file  Occupational History   Not on file  Tobacco Use   Smoking status: Every Day    Packs/day: 1.00    Years: 40.00    Pack years: 40.00    Types: Cigarettes   Smokeless tobacco: Never  Vaping Use    Vaping Use: Never used  Substance and Sexual Activity   Alcohol use: Yes    Comment: 28 beer/week   Drug use: No   Sexual activity: Not Currently  Other Topics Concern   Not on file  Social History Narrative   Not on file   Social Determinants of Health   Financial Resource Strain: Not on file  Food Insecurity: Not on file  Transportation Needs: Not on file  Physical Activity: Not on file  Stress: Not on file  Social Connections: Not on file  Intimate Partner Violence: Not on file    Outpatient Medications Prior to Visit  Medication Sig Dispense Refill   ergocalciferol (VITAMIN D2) 1.25 MG (50000 UT) capsule 1 capsule     hydrOXYzine (ATARAX) 10 MG tablet Take 1 tablet (10 mg total) by mouth 3 (three) times daily as needed for anxiety. 90 tablet 1   zolpidem (AMBIEN) 5 MG tablet Take 1 tablet (5 mg total) by mouth at bedtime as needed for sleep. 30 tablet 0   albuterol (VENTOLIN HFA) 108 (90 Base) MCG/ACT inhaler Inhale 2 puffs into the lungs every 4 (four) hours as needed. (Patient not taking: Reported on 01/15/2022)     ARIPiprazole (ABILIFY) 2 MG tablet Take 1 tablet (2 mg total) by mouth daily.  30 tablet 1   escitalopram (LEXAPRO) 20 MG tablet Take 2 tablets (40 mg total) by mouth daily. 60 tablet 1   furosemide (LASIX) 20 MG tablet Take 1 tablet (20 mg total) by mouth 2 (two) times daily. (Patient not taking: Reported on 01/15/2022) 60 tablet 11   No facility-administered medications prior to visit.    No Known Allergies  Review of Systems  Constitutional:  Positive for malaise/fatigue. Negative for chills and fever.  HENT: Negative.    Eyes: Negative.   Respiratory:  Negative for cough and shortness of breath.   Cardiovascular:  Positive for palpitations. Negative for chest pain and leg swelling.  Gastrointestinal:  Negative for abdominal pain, blood in stool, constipation, diarrhea, nausea and vomiting.  Skin: Negative.   Neurological:  Positive for dizziness and  weakness. Negative for tingling, tremors, sensory change, speech change, focal weakness, seizures, loss of consciousness and headaches.  Psychiatric/Behavioral:  Negative for depression. The patient is not nervous/anxious.   All other systems reviewed and are negative.     Objective:    Physical Exam Vitals reviewed.  Constitutional:      General: She is not in acute distress.    Appearance: Normal appearance. She is normal weight.  HENT:     Head: Normocephalic.  Cardiovascular:     Rate and Rhythm: Regular rhythm. Tachycardia present.     Pulses: Normal pulses.     Heart sounds: Normal heart sounds.     Comments: No obvious peripheral edema Pulmonary:     Effort: Pulmonary effort is normal.     Breath sounds: Normal breath sounds.  Musculoskeletal:        General: No swelling, tenderness, deformity or signs of injury. Normal range of motion.     Right lower leg: No edema.     Left lower leg: No edema.  Skin:    General: Skin is warm and dry.     Capillary Refill: Capillary refill takes less than 2 seconds.  Neurological:     General: No focal deficit present.     Mental Status: She is alert and oriented to person, place, and time.  Psychiatric:        Mood and Affect: Mood normal.        Behavior: Behavior normal.        Thought Content: Thought content normal.        Judgment: Judgment normal.    BP (!) 183/102 (BP Location: Left Arm, Cuff Size: Normal)    Pulse (!) 119    Temp 98.3 F (36.8 C)    Ht _0  (1.676 m)    Wt 120 lb 2 oz (54.5 kg)    SpO2 100%    BMI 19.39 kg/m  Wt Readings from Last 3 Encounters:  01/15/22 120 lb 2 oz (54.5 kg)  10/28/21 115 lb (52.2 kg)  09/18/21 115 lb 3.2 oz (52.3 kg)    Immunization History  Administered Date(s) Administered   Unspecified SARS-COV-2 Vaccination 03/19/2020    Diabetic Foot Exam - Simple   No data filed     Lab Results  Component Value Date   TSH 1.422 07/01/2015   Lab Results  Component Value Date    WBC 8.8 10/28/2021   HGB 13.6 10/28/2021   HCT 40.1 10/28/2021   MCV 86 10/28/2021   PLT 393 10/28/2021   Lab Results  Component Value Date   NA 129 (L) 10/28/2021   K 4.9 10/28/2021   CO2 24 10/28/2021  GLUCOSE 73 10/28/2021   BUN 11 10/28/2021   CREATININE 0.76 10/28/2021   BILITOT 0.2 10/28/2021   ALKPHOS 133 (H) 10/28/2021   AST 21 10/28/2021   ALT 15 10/28/2021   PROT 7.4 10/28/2021   ALBUMIN 4.4 10/28/2021   CALCIUM 9.7 10/28/2021   ANIONGAP 8 07/03/2015   EGFR 90 10/28/2021   Lab Results  Component Value Date   CHOL 264 (H) 07/28/2021   CHOL 262 (H) 06/23/2015   Lab Results  Component Value Date   HDL 69 07/28/2021   HDL 93 06/23/2015   Lab Results  Component Value Date   LDLCALC 181 (H) 07/28/2021   LDLCALC 149 (H) 06/23/2015   Lab Results  Component Value Date   TRIG 82 07/28/2021   TRIG 100 06/23/2015   Lab Results  Component Value Date   CHOLHDL 3.8 07/28/2021   CHOLHDL 2.8 06/23/2015   Lab Results  Component Value Date   HGBA1C 4.9 07/28/2021       Assessment & Plan:   Problem List Items Addressed This Visit       Cardiovascular and Mediastinum   Essential hypertension - Primary   Relevant Medications   metoprolol tartrate (LOPRESSOR) 25 MG tablet   Other Relevant Orders   Ambulatory referral to Cardiology   Other Visit Diagnoses     Other cardiac arrhythmia       Relevant Medications   metoprolol tartrate (LOPRESSOR) 25 MG tablet   Other Relevant Orders   Ambulatory referral to Cardiology   Mitral valve insufficiency, unspecified etiology       Relevant Medications   metoprolol tartrate (LOPRESSOR) 25 MG tablet   Other Relevant Orders   Ambulatory referral to Cardiology   Hypotension, unspecified hypotension type       Relevant Medications   metoprolol tartrate (LOPRESSOR) 25 MG tablet   Other Relevant Orders   Ambulatory referral to Cardiology   Abnormal echocardiogram       Relevant Orders   Ambulatory referral to  Cardiology  Discussed case with Attending, Dr. Doreene Burke, with joint decision making recommended Metoprolol with B/P and HR precautions. Patient to complete additional referral to cardiology. Patient notified by support staff of treatment plan and recommendations.    Follow up in 3 mths for reevaluation of cardiac symptoms after completion of cardiology appointment, sooner as needed    I am having Alisha Franco start on metoprolol tartrate. I am also having her maintain her albuterol, furosemide, ergocalciferol, ARIPiprazole, escitalopram, hydrOXYzine, and zolpidem.  Meds ordered this encounter  Medications   metoprolol tartrate (LOPRESSOR) 25 MG tablet    Sig: Take 0.5 tablets (12.5 mg total) by mouth 2 (two) times daily.    Dispense:  30 tablet    Refill:  0     Teena Dunk, NP

## 2022-01-15 NOTE — Telephone Encounter (Signed)
-----   Message from Kathrynn Speed, NP sent at 01/15/2022 12:20 PM EST ----- ?Please call this patient and notify her of new medication, metoprolol, and new follow up with cardiology.  Patient should be informed to check B/P and HR prior to taking medication. Patient to hold if HR less than 65 and/ SBP < 100.  ? ?Thank you  ? ?

## 2022-01-18 ENCOUNTER — Encounter: Payer: Self-pay | Admitting: Internal Medicine

## 2022-01-18 ENCOUNTER — Telehealth: Payer: Self-pay | Admitting: Internal Medicine

## 2022-01-18 NOTE — Telephone Encounter (Signed)
Error

## 2022-01-18 NOTE — Telephone Encounter (Signed)
Spoke to patient she stated she wanted to change providers to Dr.Hochrein.Advised I will send message to both providers. ?

## 2022-01-18 NOTE — Telephone Encounter (Signed)
Patient called to follow up on provider switch request (see 3/06 encounter). I informed her that she will be contacted once both providers respond to the request. She mentioned she had a fainting spell since calling this morning, but declined sending a message for advisement from Dr. Dawna Part. ?

## 2022-01-18 NOTE — Telephone Encounter (Signed)
New Message: ? ? ? ? ? ? ?Patient woul like to switch from Dr Harl Bowie to Dr Audie Box please. Is this alright with you both?l ?

## 2022-01-22 ENCOUNTER — Other Ambulatory Visit (HOSPITAL_COMMUNITY): Payer: Self-pay | Admitting: Physician Assistant

## 2022-01-22 ENCOUNTER — Telehealth: Payer: Self-pay | Admitting: Nurse Practitioner

## 2022-01-22 DIAGNOSIS — F411 Generalized anxiety disorder: Secondary | ICD-10-CM

## 2022-01-22 NOTE — Telephone Encounter (Signed)
Son Ines Bloomer called asking for Enid Derry to call him and give him an update on his mother's health and care. She signed a ROI form allowing [place] to speak to the son.  ?Son's #: [phone] ?

## 2022-01-27 NOTE — Telephone Encounter (Signed)
Spoke to patient she stated she prefers to see Dr.Hochrein not the HTN clinic.Appointment scheduled with Dr.Hochrein 02/11/22 at 8:30 am.She wanted to know cost of appointment since she does not currently have any insurance.Advised I will check on cost and call her back. ?

## 2022-02-02 NOTE — Telephone Encounter (Signed)
Billing spoke to patient she was given a estimated cost.  ?

## 2022-02-10 ENCOUNTER — Encounter (HOSPITAL_COMMUNITY): Payer: No Payment, Other | Admitting: Physician Assistant

## 2022-02-10 DIAGNOSIS — I340 Nonrheumatic mitral (valve) insufficiency: Secondary | ICD-10-CM | POA: Insufficient documentation

## 2022-02-10 NOTE — Progress Notes (Signed)
?  ?Cardiology Office Note ? ? ?Date:  02/11/2022  ? ?ID:  Alisha Franco, DOB 12-12-1960, MRN [id number] ? ?PCP:  Kathrynn Speed, NP  ?Cardiologist:   Rollene Rotunda, MD ? ?Chief Complaint  ?Patient presents with  ? Loss of Consciousness  ? ? ?  ?History of Present Illness: ?Alisha Franco is a 61 y.o. female who presents for follow up of HTN.  She was seen previously by another cardiologist in our practice.    She had an echo in Sept and and had a normal EF.  She has mild to moderate MR.   ? ?She says the problem really has been syncope.  She said this happened last summer.  She had a frank syncopal episode.  She subsequently had loss of bowel she says but she did not see anybody at that time.  She could feel things going gray and she actually went down to the ground.  She did get seen in follow-up as above for this.  She actually had an echocardiogram in the fall as mentioned.  She had another episode 2 weeks ago.  She felt queasy.  She felt presyncopal.  She actually had to slide herself down to the floor.  She had to crawl and stay down on the ground because she felt too lighted headed to get up.  She did not actually lose consciousness.  She did not have chest pressure, neck or arm discomfort.  She was not having necessarily palpitations at that time. ? ?She also has labile blood pressures.  She shows me her machine and she has systolics range from the 90s to the 160s.  Diastolics are from the 50s to the 80s.  She was treated previously with beta-blockers.  She has supine hypertension and said she did not really have any improvement in any of her symptoms her blood pressures even after going to a higher dose of metoprolol.  She took herself off of this. ? ?I did see a Zio patch that was done in 2019.  She had heart rates that were average 85 with predominant sinus rhythm.  There was rare ectopy.  There were no sustained arrhythmias noted. ? ?Past Medical History:  ?Diagnosis Date  ? Acute bronchitis, bacterial    ? Hypertension   ? ? ?Past Surgical History:  ?Procedure Laterality Date  ? btl    ? depression    ? TUBAL LIGATION    ? ? ? ?Current Outpatient Medications  ?Medication Sig Dispense Refill  ? diltiazem (CARDIZEM) 30 MG tablet Take 1 tablet (30 mg total) by mouth every 8 (eight) hours as needed. 30 tablet 5  ? ergocalciferol (VITAMIN D2) 1.25 MG (50000 UT) capsule 1 capsule    ? hydrOXYzine (ATARAX) 10 MG tablet TAKE 1 TABLET BY MOUTH 3 TIMES DAILY AS NEEDED FOR ANXIETY. 270 tablet 1  ? metoprolol tartrate (LOPRESSOR) 25 MG tablet Take 0.5 tablets (12.5 mg total) by mouth 2 (two) times daily. 30 tablet 0  ? zolpidem (AMBIEN) 5 MG tablet Take 1 tablet (5 mg total) by mouth at bedtime as needed for sleep. 30 tablet 0  ? ?No current facility-administered medications for this visit.  ? ? ?Allergies:   Patient has no known allergies.  ? ? ?Social History:  The patient  reports that she has been smoking cigarettes. She has a 40.00 pack-year smoking history. She has never used smokeless tobacco. She reports current alcohol use. She reports that she does not use drugs.  ? ?Family  History:  The patient's family history includes Stroke in her father and mother.  ? ? ?ROS:  Please see the history of present illness.   Otherwise, review of systems are positive for none.   All other systems are reviewed and negative.  ? ? ?PHYSICAL EXAM: ?VS:  Ht 5\' 6"  (1.676 m)   Wt 118 lb (53.5 kg)   SpO2 99%   BMI 19.05 kg/m?  , BMI Body mass index is 19.05 kg/m?. ?GENERAL:  Well appearing ?HEENT:  Pupils equal round and reactive, fundi not visualized, oral mucosa unremarkable ?NECK:  No jugular venous distention, waveform within normal limits, carotid upstroke brisk and symmetric, no bruits, no thyromegaly ?LYMPHATICS:  No cervical, inguinal adenopathy ?LUNGS:  Clear to auscultation bilaterally ?BACK:  No CVA tenderness ?CHEST:  Unremarkable ?HEART:  PMI not displaced or sustained,S1 and S2 within normal limits, no S3, no S4, no clicks,  no rubs, no murmurs ?ABD:  Flat, positive bowel sounds normal in frequency in pitch, no bruits, no rebound, no guarding, no midline pulsatile mass, no hepatomegaly, no splenomegaly ?EXT:  2 plus pulses throughout, no edema, no cyanosis no clubbing ?SKIN:  No rashes no nodules ?NEURO:  Cranial nerves II through XII grossly intact, motor grossly intact throughout ?PSYCH:  Cognitively intact, oriented to person place and time ? ? ? ?EKG:  EKG is ordered today. ?The ekg ordered today demonstrates sinus rhythm, rate 97, axis within normal limits, intervals within normal limits, premature ventricular contraction ? ? ?Recent Labs: ?07/28/2021: NT-Pro BNP CANCELED ?10/28/2021: ALT 15; BUN 11; Creatinine, Ser 0.76; Hemoglobin 13.6; Platelets 393; Potassium 4.9; Sodium 129  ? ? ?Lipid Panel ?   ?Component Value Date/Time  ? CHOL 264 (H) 07/28/2021 07/30/2021  ? TRIG 82 07/28/2021 0923  ? HDL 69 07/28/2021 0923  ? CHOLHDL 3.8 07/28/2021 0923  ? CHOLHDL 2.8 06/23/2015 1403  ? VLDL 20 06/23/2015 1403  ? LDLCALC 181 (H) 07/28/2021 07/30/2021  ? ?  ? ?Wt Readings from Last 3 Encounters:  ?02/11/22 118 lb (53.5 kg)  ?01/15/22 120 lb 2 oz (54.5 kg)  ?10/28/21 115 lb (52.2 kg)  ?  ? ? ?Other studies Reviewed: ?Additional studies/ records that were reviewed today include: Echo, event monitor report, labs, previous cardiology note. ?Review of the above records demonstrates:  Please see elsewhere in the note.   ? ? ?ASSESSMENT AND PLAN: ? ?HTN: Her blood pressure is labile.  For the most part it seems to be well controlled.  She does have some spiking blood pressures and have given her Cardizem 30 mg as needed to take for this.  However, I will give her chronic therapy given the fact that the average seems to be normotensive. ? ?MR: She understands she will need to get echocardiography follow-up probably in a couple of years. ? ?Syncope: The etiology of these 2 events is not clear.  I suspect vasovagal.  She declined wearing another monitor.  I  think she would at least need to start with a 4-week monitor and might even need an implanted loop but she would not want to consider this, she moves back to 10/30[place] where this would be more affordable.  She does promise to tell me if she has any further events and we talked about trying to record blood pressures and heart rates at that moment.  I suggested possibly an Alive Cor or a different wearable.  Of note she was not orthostatic in the hospital ? ?Current medicines are reviewed at  length with the patient today.  The patient does not have concerns regarding medicines. ? ?The following changes have been made:  no change ? ?Labs/ tests ordered today include: None ? ?Orders Placed This Encounter  ?Procedures  ? EKG 12-Lead  ? ? ? ?Disposition:   FU with me in 4 months.   ? ? ?Signed, ?Rollene RotundaJames Lataja Newland, MD  ?02/11/2022 12:36 PM    ?[facility] ? ? ? ?

## 2022-02-11 ENCOUNTER — Other Ambulatory Visit: Payer: Self-pay

## 2022-02-11 ENCOUNTER — Ambulatory Visit (INDEPENDENT_AMBULATORY_CARE_PROVIDER_SITE_OTHER): Payer: Self-pay | Admitting: Cardiology

## 2022-02-11 ENCOUNTER — Encounter: Payer: Self-pay | Admitting: Cardiology

## 2022-02-11 VITALS — Ht 66.0 in | Wt 118.0 lb

## 2022-02-11 DIAGNOSIS — I10 Essential (primary) hypertension: Secondary | ICD-10-CM

## 2022-02-11 DIAGNOSIS — I340 Nonrheumatic mitral (valve) insufficiency: Secondary | ICD-10-CM

## 2022-02-11 MED ORDER — DILTIAZEM HCL 30 MG PO TABS
30.0000 mg | ORAL_TABLET | Freq: Three times a day (TID) | ORAL | 5 refills | Status: AC | PRN
  Filled 2022-02-11: qty 30, 10d supply, fill #0

## 2022-02-11 NOTE — Patient Instructions (Signed)
Medication Instructions:  ?START Cardizem 30 mg every 8 hours as needed. ? ?*If you need a refill on your cardiac medications before your next appointment, please call your pharmacy* ? ? ?Lab Work: ?None ordered ?If you have labs (blood work) drawn today and your tests are completely normal, you will receive your results only by: ?MyChart Message (if you have MyChart) OR ?A paper copy in the mail ?If you have any lab test that is abnormal or we need to change your treatment, we will call you to review the results. ? ? ?Testing/Procedures: ?None ordered ? ? ?Follow-Up: ?At [facility], you and your health needs are our priority.  As part of our continuing mission to provide you with exceptional heart care, we have created designated Provider Care Teams.  These Care Teams include your primary Cardiologist (physician) and Advanced Practice Providers (APPs -  Physician Assistants and Nurse Practitioners) who all work together to provide you with the care you need, when you need it. ? ?We recommend signing up for the patient portal called "MyChart".  Sign up information is provided on this After Visit Summary.  MyChart is used to connect with patients for Virtual Visits (Telemedicine).  Patients are able to view lab/test results, encounter notes, upcoming appointments, etc.  Non-urgent messages can be sent to your provider as well.   ?To learn more about what you can do with MyChart, go to [email or web address].   ? ?Your next appointment:   ?4 month(s) ? ?The format for your next appointment:   ?In Person ? ?Provider:   ?Dr. Antoine Poche ? ?

## 2022-02-18 ENCOUNTER — Other Ambulatory Visit: Payer: Self-pay

## 2022-02-26 ENCOUNTER — Ambulatory Visit (INDEPENDENT_AMBULATORY_CARE_PROVIDER_SITE_OTHER): Payer: No Payment, Other | Admitting: Physician Assistant

## 2022-02-26 ENCOUNTER — Encounter (HOSPITAL_COMMUNITY): Payer: Self-pay | Admitting: Physician Assistant

## 2022-02-26 DIAGNOSIS — F411 Generalized anxiety disorder: Secondary | ICD-10-CM

## 2022-02-26 DIAGNOSIS — G479 Sleep disorder, unspecified: Secondary | ICD-10-CM

## 2022-02-26 MED ORDER — HYDROXYZINE HCL 10 MG PO TABS: 10.0000 mg | ORAL_TABLET | Freq: Three times a day (TID) | ORAL | 1 refills | Status: AC | PRN

## 2022-02-26 MED ORDER — ZOLPIDEM TARTRATE 5 MG PO TABS: 5.0000 mg | ORAL_TABLET | Freq: Every evening | ORAL | 0 refills | Status: AC | PRN

## 2022-02-26 NOTE — Progress Notes (Signed)
BH MD/PA/NP OP Progress Note ? ?02/26/2022 11:39 PM ?Alisha Franco  ?MRN:  [id number] ? ?Chief Complaint:  ?Chief Complaint  ?Patient presents with  ? Medication Management  ?  F/U  ? ?HPI:  ? ?Alisha Franco is a 61 year old female with a past psychiatric history significant for anxiety, major depressive disorder, and sleep disturbances who presents to [facility] for follow-up and medication management.  Patient is currently being managed on the following medications: ? ?Hydroxyzine 10 mg 3 times daily as needed ?Zolpidem 5 mg at bedtime as needed ? ?Patient reports that she is not doing so good.  Patient endorses elevated anxiety.  She reports that she has tried taking 2 hydroxyzine to help alleviate her anxiety with very little success.  Patient reports that she continues to toss and turn at night and endorses taking 2 Ambien to help with sleep with no success.  Patient reports that she tried taking Motrin, Tylenol, and Ambien to help with her sleep as well with no success.  Patient's sleep appears to be disrupted by her sciatica as well as racing thoughts.  Patient also endorses excessive worrying due to her current financial situation.  Patient reports that she had to end up selling her house due to being unable to afford her mortgage. ? ?Patient reports that she is currently not working but states that her son-in-law bought her house.  Patient endorses depression stating that she is under a lot of stress.  She reports that her cardiologist recommended that she be placed on a heart monitor but the patient is unable to afford the cost of a heart monitor.  Patient still has aspirations of moving back to [place] but acknowledges the various obstacles preventing her from heading back sooner.  A PHQ-9 screen was performed with the patient scoring a 17.  A GAD-7 screen was also performed with the patient scoring a 14. ? ?Patient is alert and oriented x4, calm, cooperative, and  fully engaged in conversation during the encounter.  Patient exhibits low mood due to her current situation and stressors.  Patient denies suicidal or homicidal ideations.  She further denies auditory or visual hallucinations and does not appear to be responding to internal/external stimuli.  Patient endorses fluctuating sleep that does not appear to be alleviated through the use of her Ambien.  Patient endorses having odd eating habits and states that she has been losing weight.  Patient endorses alcohol consumption on occasion.  Patient endorses tobacco use and smokes on average 2 packs a day.  Patient denies illicit drug use. ? ?Visit Diagnosis:  ?  ICD-10-CM   ?1. Anxiety state  F41.1 hydrOXYzine (ATARAX) 10 MG tablet  ?  ?2. Sleep disturbance  G47.9 zolpidem (AMBIEN) 5 MG tablet  ?  ? ? ?Past Psychiatric History:  ?Major depressive disorder ?Generalized anxiety disorder ?Insomnia ? ?Past Medical History:  ?Past Medical History:  ?Diagnosis Date  ? Acute bronchitis, bacterial   ? Hypertension   ?  ?Past Surgical History:  ?Procedure Laterality Date  ? btl    ? depression    ? TUBAL LIGATION    ? ? ?Family Psychiatric History:  ?Mother - Depression, patient states that her mother's depression worsened after the patient's father had experienced 2 strokes with the second stroke killing him ? ?Family History:  ?Family History  ?Problem Relation Age of Onset  ? Stroke Mother   ? Stroke Father   ? ? ?Social History:  ?Social History  ? ?  Socioeconomic History  ? Marital status: Legally Separated  ?  Spouse name: Not on file  ? Number of children: Not on file  ? Years of education: Not on file  ? Highest education level: Not on file  ?Occupational History  ? Not on file  ?Tobacco Use  ? Smoking status: Every Day  ?  Packs/day: 1.00  ?  Years: 40.00  ?  Pack years: 40.00  ?  Types: Cigarettes  ? Smokeless tobacco: Never  ?Vaping Use  ? Vaping Use: Never used  ?Substance and Sexual Activity  ? Alcohol use: Yes  ?   Comment: 28 beer/week  ? Drug use: No  ? Sexual activity: Not Currently  ?Other Topics Concern  ? Not on file  ?Social History Narrative  ? Not on file  ? ?Social Determinants of Health  ? ?Financial Resource Strain: Not on file  ?Food Insecurity: Not on file  ?Transportation Needs: Not on file  ?Physical Activity: Not on file  ?Stress: Not on file  ?Social Connections: Not on file  ? ? ?Allergies: No Known Allergies ? ?Metabolic Disorder Labs: ?Lab Results  ?Component Value Date  ? HGBA1C 4.9 07/28/2021  ? ?No results found for: PROLACTIN ?Lab Results  ?Component Value Date  ? CHOL 264 (H) 07/28/2021  ? TRIG 82 07/28/2021  ? HDL 69 07/28/2021  ? CHOLHDL 3.8 07/28/2021  ? VLDL 20 06/23/2015  ? LDLCALC 181 (H) 07/28/2021  ? LDLCALC 149 (H) 06/23/2015  ? ?Lab Results  ?Component Value Date  ? TSH 1.422 07/01/2015  ? ? ?Therapeutic Level Labs: ?No results found for: LITHIUM ?No results found for: VALPROATE ?No components found for:  CBMZ ? ?Current Medications: ?Current Outpatient Medications  ?Medication Sig Dispense Refill  ? diltiazem (CARDIZEM) 30 MG tablet Take 1 tablet (30 mg total) by mouth every 8 (eight) hours as needed. 30 tablet 5  ? ergocalciferol (VITAMIN D2) 1.25 MG (50000 UT) capsule 1 capsule    ? metoprolol tartrate (LOPRESSOR) 25 MG tablet Take 0.5 tablets (12.5 mg total) by mouth 2 (two) times daily. 30 tablet 0  ? hydrOXYzine (ATARAX) 10 MG tablet Take 1 tablet (10 mg total) by mouth 3 (three) times daily as needed for anxiety. 270 tablet 1  ? zolpidem (AMBIEN) 5 MG tablet Take 1 tablet (5 mg total) by mouth at bedtime as needed for sleep. 30 tablet 0  ? ?No current facility-administered medications for this visit.  ? ? ? ?Musculoskeletal: ?Strength & Muscle Tone: within normal limits ?Gait & Station: normal ?Patient leans: N/A ? ?Psychiatric Specialty Exam: ?Review of Systems  ?Psychiatric/Behavioral:  Positive for dysphoric mood and sleep disturbance. Negative for decreased concentration,  hallucinations, self-injury and suicidal ideas. The patient is not nervous/anxious and is not hyperactive.    ?Blood pressure (!) 186/97, pulse 95, height 5\' 6"  (1.676 m), weight 117 lb (53.1 kg).Body mass index is 18.88 kg/m?.  ?General Appearance: Well Groomed  ?Eye Contact:  Good  ?Speech:  Clear and Coherent and Normal Rate  ?Volume:  Normal  ?Mood:  Depressed  ?Affect:  Congruent and Depressed  ?Thought Process:  Coherent and Descriptions of Associations: Intact  ?Orientation:  Full (Time, Place, and Person)  ?Thought Content: WDL   ?Suicidal Thoughts:  No  ?Homicidal Thoughts:  No  ?Memory:  Immediate;   Good ?Recent;   Good ?Remote;   Good  ?Judgement:  Good  ?Insight:  Good  ?Psychomotor Activity:  Normal  ?Concentration:  Concentration: Good and Attention  Span: Good  ?Recall:  Good  ?Fund of Knowledge: Good  ?Language: Good  ?Akathisia:  No  ?Handed:  Right  ?AIMS (if indicated): not done  ?Assets:  Communication Skills ?Desire for Improvement ?Housing ?Social Support  ?ADL's:  Intact  ?Cognition: WNL  ?Sleep:  Poor  ? ?Screenings: ?GAD-7   ? ?Flow[facility] Row [facility]  ?Total GAD-7 Score 14 8 11 17   ? ?  ? ?PHQ2-9   ? ?Flowsheet Row [facility]  ?PHQ-2 Total Score 6 6 3 4  0  ?PHQ-9 Total Score 17 14 11 14  --  ? ?  ? ?Flowsheet Row [facility]  [facility]  ?C-SSRS RISK CATEGORY Low Risk Low Risk Low Risk  ? ?  ? ? ? ?Assessment and Plan:  ? ?

## 2022-04-13 ENCOUNTER — Encounter (HOSPITAL_COMMUNITY): Payer: No Payment, Other | Admitting: Physician Assistant

## 2022-04-19 ENCOUNTER — Ambulatory Visit: Payer: Self-pay | Admitting: Nurse Practitioner

## 2022-05-19 ENCOUNTER — Ambulatory Visit: Payer: Self-pay | Admitting: Cardiology
